# Patient Record
Sex: Female | Born: 1995 | Race: Black or African American | Hispanic: No | Marital: Single | Smoking: Never smoker
Health system: Southern US, Community
[De-identification: ages and names within clinical notes are randomized; demographics above are authoritative.]

## PROBLEM LIST (undated history)

## (undated) DIAGNOSIS — R0789 Other chest pain: Secondary | ICD-10-CM

## (undated) DIAGNOSIS — I38 Endocarditis, valve unspecified: Secondary | ICD-10-CM

## (undated) HISTORY — PX: WISDOM TOOTH EXTRACTION: SHX21

## (undated) HISTORY — DX: Endocarditis, valve unspecified: I38

## (undated) HISTORY — DX: Other chest pain: R07.89

---

## 2018-10-04 ENCOUNTER — Emergency Department (HOSPITAL_COMMUNITY): Payer: BLUE CROSS/BLUE SHIELD

## 2018-10-04 ENCOUNTER — Encounter (HOSPITAL_COMMUNITY): Payer: Self-pay

## 2018-10-04 ENCOUNTER — Inpatient Hospital Stay (HOSPITAL_COMMUNITY)
Admission: EM | Admit: 2018-10-04 | Discharge: 2018-10-08 | DRG: 307 | Disposition: A | Discharge status: Home Health | Payer: BLUE CROSS/BLUE SHIELD | Attending: Cardiovascular Disease | Admitting: Cardiovascular Disease

## 2018-10-04 ENCOUNTER — Other Ambulatory Visit: Payer: Self-pay

## 2018-10-04 DIAGNOSIS — R079 Chest pain, unspecified: Secondary | ICD-10-CM

## 2018-10-04 DIAGNOSIS — F419 Anxiety disorder, unspecified: Secondary | ICD-10-CM | POA: Diagnosis present

## 2018-10-04 DIAGNOSIS — I4510 Unspecified right bundle-branch block: Secondary | ICD-10-CM | POA: Diagnosis present

## 2018-10-04 DIAGNOSIS — K011 Impacted teeth: Secondary | ICD-10-CM | POA: Diagnosis present

## 2018-10-04 DIAGNOSIS — I011 Acute rheumatic endocarditis: Principal | ICD-10-CM | POA: Diagnosis present

## 2018-10-04 DIAGNOSIS — I330 Acute and subacute infective endocarditis: Secondary | ICD-10-CM | POA: Diagnosis not present

## 2018-10-04 DIAGNOSIS — Z793 Long term (current) use of hormonal contraceptives: Secondary | ICD-10-CM | POA: Diagnosis not present

## 2018-10-04 DIAGNOSIS — Z0389 Encounter for observation for other suspected diseases and conditions ruled out: Secondary | ICD-10-CM

## 2018-10-04 DIAGNOSIS — Z79899 Other long term (current) drug therapy: Secondary | ICD-10-CM | POA: Diagnosis not present

## 2018-10-04 DIAGNOSIS — I442 Atrioventricular block, complete: Secondary | ICD-10-CM

## 2018-10-04 LAB — BASIC METABOLIC PANEL
ANION GAP: 12 (ref 5–15)
BUN: 10 mg/dL (ref 6–20)
CO2: 22 mmol/L (ref 22–32)
Calcium: 9.7 mg/dL (ref 8.9–10.3)
Chloride: 104 mmol/L (ref 98–111)
Creatinine, Ser: 0.75 mg/dL (ref 0.44–1.00)
GFR calc Af Amer: >60 mL/min (ref >60)
GFR calc non Af Amer: >60 mL/min (ref >60)
Glucose, Bld: 97 mg/dL (ref 70–99)
Potassium: 3.6 mmol/L (ref 3.5–5.1)
Sodium: 138 mmol/L (ref 135–145)

## 2018-10-04 LAB — TSH: TSH: 3.072 u[IU]/mL (ref 0.350–4.500)

## 2018-10-04 LAB — CBC
HCT: 42.4 % (ref 36.0–46.0)
HEMOGLOBIN: 13.6 g/dL (ref 12.0–15.0)
MCH: 26.5 pg (ref 26.0–34.0)
MCHC: 32.1 g/dL (ref 30.0–36.0)
MCV: 82.5 fL (ref 80.0–100.0)
Platelets: 221 10*3/uL (ref 150–400)
RBC: 5.14 MIL/uL — ABNORMAL HIGH (ref 3.87–5.11)
RDW: 14.2 % (ref 11.5–15.5)
WBC: 8.6 10*3/uL (ref 4.0–10.5)
nRBC: 0 % (ref 0.0–0.2)

## 2018-10-04 LAB — ECHOCARDIOGRAM COMPLETE
Height: 63 in
Weight: 3008 oz

## 2018-10-04 LAB — SEDIMENTATION RATE: Sed Rate: 9 mm/hr (ref 0–22)

## 2018-10-04 LAB — I-STAT BETA HCG BLOOD, ED (MC, WL, AP ONLY)

## 2018-10-04 LAB — TROPONIN I: Troponin I: <0.03 ng/mL (ref <0.03)

## 2018-10-04 LAB — C-REACTIVE PROTEIN: CRP: <0.8 mg/dL (ref <1.0)

## 2018-10-04 LAB — I-STAT TROPONIN, ED: Troponin i, poc: 0.01 ng/mL (ref 0.00–0.08)

## 2018-10-04 LAB — BRAIN NATRIURETIC PEPTIDE: B Natriuretic Peptide: 34.2 pg/mL (ref 0.0–100.0)

## 2018-10-04 LAB — MAGNESIUM: Magnesium: 2.1 mg/dL (ref 1.7–2.4)

## 2018-10-04 MED ORDER — HYDROXYZINE HCL 25 MG PO TABS
25.0000 mg | ORAL_TABLET | Freq: Four times a day (QID) | ORAL | Status: DC | PRN
  Filled 2018-10-04: qty 1

## 2018-10-04 MED ORDER — NORETHIN ACE-ETH ESTRAD-FE 1-20 MG-MCG PO TABS
1.0000 | ORAL_TABLET | Freq: Every day | ORAL | Status: DC
  Administered 2018-10-04: 1 via ORAL
  Filled 2018-10-04: qty 1

## 2018-10-04 MED ORDER — ACETAMINOPHEN 325 MG PO TABS
650.0000 mg | ORAL_TABLET | Freq: Four times a day (QID) | ORAL | Status: DC | PRN
  Administered 2018-10-04 – 2018-10-06 (×5): 650 mg via ORAL
  Filled 2018-10-04 (×6): qty 2

## 2018-10-04 MED ORDER — SODIUM CHLORIDE 0.9% FLUSH
3.0000 mL | Freq: Two times a day (BID) | INTRAVENOUS | Status: DC
  Administered 2018-10-04 – 2018-10-07 (×6): 3 mL via INTRAVENOUS

## 2018-10-04 MED ORDER — HEPARIN SODIUM (PORCINE) 5000 UNIT/ML IJ SOLN
5000.0000 [IU] | Freq: Three times a day (TID) | INTRAMUSCULAR | Status: DC
  Administered 2018-10-04 – 2018-10-08 (×12): 5000 [IU] via SUBCUTANEOUS
  Filled 2018-10-04 (×12): qty 1

## 2018-10-04 MED ORDER — SODIUM CHLORIDE 0.9% FLUSH
3.0000 mL | Freq: Once | INTRAVENOUS | Status: AC
  Administered 2018-10-04: 3 mL via INTRAVENOUS

## 2018-10-04 MED ORDER — SODIUM CHLORIDE 0.9% FLUSH: 3.0000 mL | INTRAVENOUS | Status: DC | PRN

## 2018-10-04 MED ORDER — SODIUM CHLORIDE 0.9 % IV SOLN
250.0000 mL | INTRAVENOUS | Status: DC | PRN
  Administered 2018-10-06: 250 mL via INTRAVENOUS

## 2018-10-04 MED ORDER — LIDOCAINE VISCOUS HCL 2 % MT SOLN
15.0000 mL | Freq: Once | OROMUCOSAL | Status: AC
  Administered 2018-10-04: 15 mL via ORAL
  Filled 2018-10-04: qty 15

## 2018-10-04 MED ORDER — SODIUM CHLORIDE 0.9 % IV SOLN
2.0000 g | INTRAVENOUS | Status: DC
  Administered 2018-10-04 – 2018-10-08 (×5): 2 g via INTRAVENOUS
  Filled 2018-10-04 (×5): qty 20

## 2018-10-04 MED ORDER — VANCOMYCIN HCL 10 G IV SOLR
1250.0000 mg | Freq: Two times a day (BID) | INTRAVENOUS | Status: DC
  Administered 2018-10-05 – 2018-10-08 (×7): 1250 mg via INTRAVENOUS
  Filled 2018-10-04 (×7): qty 1250

## 2018-10-04 MED ORDER — ALUM & MAG HYDROXIDE-SIMETH 200-200-20 MG/5ML PO SUSP
30.0000 mL | Freq: Once | ORAL | Status: AC
  Administered 2018-10-04: 30 mL via ORAL
  Filled 2018-10-04: qty 30

## 2018-10-04 MED ORDER — VANCOMYCIN HCL 10 G IV SOLR
2000.0000 mg | INTRAVENOUS | Status: AC
  Administered 2018-10-04: 2000 mg via INTRAVENOUS
  Filled 2018-10-04: qty 2000

## 2018-10-04 MED ORDER — SODIUM CHLORIDE 0.9 % IV BOLUS
1000.0000 mL | Freq: Once | INTRAVENOUS | Status: AC
  Administered 2018-10-04: 1000 mL via INTRAVENOUS

## 2018-10-04 MED ORDER — LORAZEPAM 2 MG/ML IJ SOLN
1.0000 mg | Freq: Once | INTRAMUSCULAR | Status: AC
  Administered 2018-10-04: 1 mg via INTRAVENOUS
  Filled 2018-10-04: qty 1

## 2018-10-04 NOTE — ED Notes (Signed)
As I write this, pt. Is undergoing 2D Echo. Dr. Algie Coffer is here also. Pt. Tells [place] she feels "better".

## 2018-10-04 NOTE — ED Notes (Signed)
I have just phoned report to Pamela Maynard at [facility]. I will notify [facility] now. Pt. Remains in no distress.

## 2018-10-04 NOTE — ED Notes (Signed)
She is taken to [facility] at this time without incident by [facility].

## 2018-10-04 NOTE — H&P (Signed)
Referring Physician:   [name] is an 23 y.o. female.                       Chief Complaint: Chest pain  HPI: 23 year old female had mild lower abdominal cramping worsening to pressure type discomfort in epigastric area radiating to chest and back associated with exertional shortness of breath and palpitations. Chest pain is more when lying flat. No fever or chills. No IV drug abuse. She admits to smoking Marijuana few days a week. She had viral infection several weeks ago. Her EKG shows recurrent idioventricular rhythm to sinus rhythm with frequent premature ventricular complexes. Her echocardiogram showed normal LV and RV systolic function but small mobile vegetations on Mitral valve with trivial MR. Her CBC, BMET, CRP, TSH and Troponin I are normal. Blood cultures are pending.  History reviewed. No pertinent past medical history. No type 2 DM, No HTN, No hyperlipidemia.   Past Surgical History:  Procedure Laterality Date  . WISDOM TOOTH EXTRACTION      Family History  Problem Relation Age of Onset  . Hypertension Mother   . Diabetes Mother    Social History:  reports that she has never smoked. She has never used smokeless tobacco. She reports current alcohol use. She reports current drug use. Drug: Marijuana.  Allergies: No Known Allergies  (Not in a hospital admission)   Results for orders placed or performed during the hospital encounter of 10/04/18 (from the past 48 hour(s))  Basic metabolic panel     Status: None   Collection Time: 10/04/18 11:20 AM  Result Value Ref Range   Sodium 138 135 - 145 mmol/L   Potassium 3.6 3.5 - 5.1 mmol/L   Chloride 104 98 - 111 mmol/L   CO2 22 22 - 32 mmol/L   Glucose, Bld 97 70 - 99 mg/dL   BUN 10 6 - 20 mg/dL   Creatinine, Ser 9.73 0.44 - 1.00 mg/dL   Calcium 9.7 8.9 - 53.2 mg/dL   GFR calc non Af Amer >60 >60 mL/min   GFR calc Af Amer >60 >60 mL/min   Anion gap 12 5 - 15    Comment: Performed at [facility],  [address], [place], [place] [address]  CBC     Status: Abnormal   Collection Time: 10/04/18 11:20 AM  Result Value Ref Range   WBC 8.6 4.0 - 10.5 K/uL   RBC 5.14 (H) 3.87 - 5.11 MIL/uL   Hemoglobin 13.6 12.0 - 15.0 g/dL   HCT 68.3 41.9 - 62.2 %   MCV 82.5 80.0 - 100.0 fL   MCH 26.5 26.0 - 34.0 pg   MCHC 32.1 30.0 - 36.0 g/dL   RDW 29.7 98.9 - 21.1 %   Platelets 221 150 - 400 K/uL   nRBC 0.0 0.0 - 0.2 %    Comment: Performed at [facility], [address] [place], [place] [address]  Magnesium     Status: None   Collection Time: 10/04/18 11:20 AM  Result Value Ref Range   Magnesium 2.1 1.7 - 2.4 mg/dL    Comment: Performed at [facility], [address] [place], [place] [address]  TSH     Status: None   Collection Time: 10/04/18 11:20 AM  Result Value Ref Range   TSH 3.072 0.350 - 4.500 uIU/mL    Comment: Performed by a 3rd Generation assay with a functional sensitivity of <=0.01 uIU/mL.  Performed at [facility], [address] [place], [place] [address]   C-reactive protein     Status: None   Collection Time: 10/04/18 11:25 AM  Result Value Ref Range   CRP <0.8 <1.0 mg/dL    Comment: Performed at [facility], [address] [place], [place] [address]  I-stat troponin, ED     Status: None   Collection Time: 10/04/18 11:30 AM  Result Value Ref Range   Troponin i, poc 0.01 0.00 - 0.08 ng/mL   Comment 3            Comment: Due to the release kinetics of cTnI, a negative result within the first hours of the onset of symptoms does not rule out myocardial infarction with certainty. If myocardial infarction is still suspected, repeat the test at appropriate intervals.   Troponin I - ONCE - STAT     Status: None   Collection Time: 10/04/18 11:30 AM  Result Value Ref Range   Troponin I <0.03 <0.03 ng/mL    Comment: Performed at [facility], [address]  [place], [place] [address]  Brain natriuretic peptide     Status: None   Collection Time: 10/04/18 11:30 AM  Result Value Ref Range   B Natriuretic Peptide 34.2 0.0 - 100.0 pg/mL    Comment: Performed at [facility], [address] [place], [place] [address]  I-Stat beta hCG blood, ED     Status: None   Collection Time: 10/04/18 11:31 AM  Result Value Ref Range   I-stat hCG, quantitative <5.0 <5 mIU/mL   Comment 3            Comment:   GEST. AGE      CONC.  (mIU/mL)   <=1 WEEK        5 - 50     2 WEEKS       50 - 500     3 WEEKS       100 - 10,000     4 WEEKS     1,000 - 30,000        FEMALE AND NON-PREGNANT FEMALE:     LESS THAN 5 mIU/mL   Sedimentation rate     Status: None   Collection Time: 10/04/18 12:12 PM  Result Value Ref Range   Sed Rate 9 0 - 22 mm/hr    Comment: Performed at [facility], [address] [place], [place] [address]   Dg Chest Portable 1 View  Result Date: 10/04/2018 CLINICAL DATA:  Chest pain for 2 days with shortness of breath EXAM: PORTABLE CHEST 1 VIEW COMPARISON:  None. FINDINGS: The heart size and mediastinal contours are within normal limits. Both lungs are clear. The visualized skeletal structures are unremarkable. IMPRESSION: No active disease. Electronically Signed   By: Alcide CleverMark  Lukens M.D.   On: 10/04/2018 12:49    Review Of Systems Constitutional: No fever, chills, weight loss or gain. Eyes: No vision change, wears glasses. No discharge or pain. Ears: No hearing loss, No tinnitus. Respiratory: No asthma, COPD, pneumonias. Positive shortness of breath. No hemoptysis. Cardiovascular: Positive chest pain, palpitation, no leg edema. Gastrointestinal: No nausea, vomiting, diarrhea, constipation. No GI bleed. No hepatitis. Genitourinary: No dysuria, hematuria, kidney stone. No incontinance. Neurological: No headache, stroke, seizures.  Psychiatry: No psych facility admission for anxiety, depression, suicide. No  detox. Skin: No rash. Musculoskeletal: No joint pain, no fibromyalgia. No neck pain, back pain. Lymphadenopathy:  No lymphadenopathy. Hematology: No anemia or easy bruising.   Blood pressure (!) 162/79, pulse 88, temperature 97.8 F (36.6 C), temperature source Oral, resp. rate 18, height 5\' 3"  (1.6 m), weight 85.3 kg, last menstrual period 10/04/2018, SpO2 98 %. Body mass index is 33.3 kg/m. General appearance: alert, cooperative, appears stated age and no distress Head: Normocephalic, atraumatic. Eyes: Brown eyes, pink conjunctiva, corneas clear. PERRL, EOM's intact. Neck: No adenopathy, no carotid bruit, no JVD, supple, symmetrical, trachea midline and thyroid not enlarged. Resp: Clear to auscultation bilaterally. Cardio: Regular rate and rhythm, S1, S2 normal, II/VI systolic murmur, no click, rub or gallop. Fixed split S2 when in idioventricular rhythm with RBBB. GI: Soft, non-tender; bowel sounds normal; no organomegaly. Extremities: No edema, cyanosis or clubbing. Skin: Warm and dry.  Neurologic: Alert and oriented X 3, normal strength. Normal coordination and gait.  Assessment/Plan Acute MV endocarditis Chest pain Shortness of breath Palpitation Recurrent idioventricular rhythm  Admit to cardiac Telemetry. Blood culture x 2 x 2. ID consult for antibiotic use. TEE tomorrow.  Ricki Rodriguez, MD  10/04/2018, 2:34 PM

## 2018-10-04 NOTE — ED Notes (Signed)
ED TO INPATIENT HANDOFF REPORT  Name/Age/Gender Pamela Maynard 23 y.o. female  Code Status    Code Status Orders  (From admission, onward)         Start     Ordered   10/04/18 1426  Full code  Continuous     10/04/18 1430        Code Status History    This patient has a current code status but no historical code status.      Home/SNF/Other Home  Chief Complaint chest pain  Level of Care/Admitting Diagnosis ED Disposition    ED Disposition Condition Comment   Admit  [facility] [[id number]]  Level of Care: Cardiac Telemetry [103]  Diagnosis: Endocarditis of mitral valve [[id number]][[id number]]  Admitting Physician: Orpah CobbKADAKIA, AJAY [[id number]]  Attending Physician: Orpah CobbKADAKIA, AJAY [[id number]]  Estimated length of stay: 5 - 7 days  Certification:: I certify this patient will need inpatient services for at least 2 midnights  PT Class (Do Not Modify): Inpatient [101]  PT Acc Code (Do Not Modify): Private [1]       Medical History History reviewed. No pertinent past medical history.  Allergies No Known Allergies  IV Location/Drains/Wounds Patient Lines/Drains/Airways Status   Active Line/Drains/Airways    Name:   Placement date:   Placement time:   Site:   Days:   Peripheral IV 10/04/18 Right Antecubital   10/04/18    1131    Antecubital   less than 1          Labs/Imaging Results for orders placed or performed during the hospital encounter of 10/04/18 (from the past 48 hour(s))  Basic metabolic panel     Status: None   Collection Time: 10/04/18 11:20 AM  Result Value Ref Range   Sodium 138 135 - 145 mmol/L   Potassium 3.6 3.5 - 5.1 mmol/L   Chloride 104 98 - 111 mmol/L   CO2 22 22 - 32 mmol/L   Glucose, Bld 97 70 - 99 mg/dL   BUN 10 6 - 20 mg/dL   Creatinine, Ser 8.110.75 0.44 - 1.00 mg/dL   Calcium 9.7 8.9 - 91.410.3 mg/dL   GFR calc non Af Amer >60 >60 mL/min   GFR calc Af Amer >60 >60 mL/min   Anion gap 12 5 - 15    Comment: Performed at [facility], [address] [place], [place] [address]  CBC     Status: Abnormal   Collection Time: 10/04/18 11:20 AM  Result Value Ref Range   WBC 8.6 4.0 - 10.5 K/uL   RBC 5.14 (H) 3.87 - 5.11 MIL/uL   Hemoglobin 13.6 12.0 - 15.0 g/dL   HCT 62.142.4 30.836.0 - 65.746.0 %   MCV 82.5 80.0 - 100.0 fL   MCH 26.5 26.0 - 34.0 pg   MCHC 32.1 30.0 - 36.0 g/dL   RDW 84.614.2 96.211.5 - 95.215.5 %   Platelets 221 150 - 400 K/uL   nRBC 0.0 0.0 - 0.2 %    Comment: Performed at [facility], [address] [place], [place] [address]  Magnesium     Status: None   Collection Time: 10/04/18 11:20 AM  Result Value Ref Range   Magnesium 2.1 1.7 - 2.4 mg/dL    Comment: Performed at [facility], [address] [place], [place] [address]  TSH     Status: None   Collection Time: 10/04/18 11:20 AM  Result Value Ref Range   TSH  3.072 0.350 - 4.500 uIU/mL    Comment: Performed by a 3rd Generation assay with a functional sensitivity of <=0.01 uIU/mL. Performed at [facility], [address] [place], [place] [address]   C-reactive protein     Status: None   Collection Time: 10/04/18 11:25 AM  Result Value Ref Range   CRP <0.8 <1.0 mg/dL    Comment: Performed at [facility], [address] [place], [place] [address]  I-stat troponin, ED     Status: None   Collection Time: 10/04/18 11:30 AM  Result Value Ref Range   Troponin i, poc 0.01 0.00 - 0.08 ng/mL   Comment 3            Comment: Due to the release kinetics of cTnI, a negative result within the first hours of the onset of symptoms does not rule out myocardial infarction with certainty. If myocardial infarction is still suspected, repeat the test at appropriate intervals.   Troponin I - ONCE - STAT     Status: None   Collection Time: 10/04/18 11:30 AM  Result Value Ref Range   Troponin I <0.03 <0.03 ng/mL    Comment: Performed at [facility], [address] [place], [place] [address]  Brain natriuretic peptide     Status: None   Collection Time: 10/04/18 11:30 AM  Result Value Ref Range   B Natriuretic Peptide 34.2 0.0 - 100.0 pg/mL    Comment: Performed at [facility], [address] [place], [place] [address]  I-Stat beta hCG blood, ED     Status: None   Collection Time: 10/04/18 11:31 AM  Result Value Ref Range   I-stat hCG, quantitative <5.0 <5 mIU/mL   Comment 3            Comment:   GEST. AGE      CONC.  (mIU/mL)   <=1 WEEK        5 - 50     2 WEEKS       50 - 500     3 WEEKS       100 - 10,000     4 WEEKS     1,000 - 30,000        FEMALE AND NON-PREGNANT FEMALE:     LESS THAN 5 mIU/mL   Sedimentation rate     Status: None   Collection Time: 10/04/18 12:12 PM  Result Value Ref Range   Sed Rate 9 0 - 22 mm/hr    Comment: Performed at [facility], [address] [place], [place] [address]   Dg Chest Portable 1 View  Result Date: 10/04/2018 CLINICAL DATA:  Chest pain for 2 days with shortness of breath EXAM: PORTABLE CHEST 1 VIEW COMPARISON:  None. FINDINGS: The heart size and mediastinal contours are within normal limits. Both lungs are clear. The visualized skeletal structures are unremarkable. IMPRESSION: No active disease. Electronically Signed   By: Alcide CleverMark  Lukens M.D.   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   Question:  Patient immune status  Answer:  Normal   10/04/18 1433   10/04/18 1429  Urinalysis, Routine w reflex microscopic  Once,   R     10/04/18 1430   10/04/18 1425  HIV antibody (Routine Testing)  Once,   R     10/04/18 1430          Vitals/Pain Today's Vitals   10/04/18 1400 10/04/18 1430 10/04/18 1446 10/04/18 1500  BP: 109/66 124/82 124/82 107/63  Pulse: 62 92 67 65  Resp: (!) 25 19 16 15   Temp:      TempSrc:      SpO2: 99% 100% 100% 100%  Weight:      Height:      PainSc:        Isolation Precautions No active isolations  Medications Medications  heparin injection 5,000 Units (has no administration in time range)  sodium chloride flush (NS) 0.9 % injection 3 mL (has no administration in time range)  sodium chloride flush (NS) 0.9 % injection 3 mL (has no administration in time range)  0.9 %  sodium chloride infusion (has no administration in time range)  hydrOXYzine (ATARAX/VISTARIL) tablet 25 mg (has no administration in time range)  norethindrone-ethinyl estradiol (JUNEL FE,GILDESS FE,LOESTRIN FE) 1-20 MG-MCG per tablet 1 tablet (has no administration in time range)  cefTRIAXone (ROCEPHIN) 2 g in sodium chloride 0.9 % 100 mL IVPB (has no administration in time range)  sodium chloride flush (NS) 0.9 % injection 3 mL (3 mLs Intravenous Given 10/04/18 1110)  sodium chloride 0.9 % bolus 1,000 mL (0 mLs Intravenous Stopped 10/04/18 1330)  LORazepam (ATIVAN) injection 1 mg (1 mg Intravenous Given 10/04/18 1225)  alum & mag hydroxide-simeth (MAALOX/MYLANTA) 200-200-20 MG/5ML suspension 30 mL (30 mLs Oral Given 10/04/18 1224)    And  lidocaine (XYLOCAINE) 2 % viscous mouth solution 15 mL (15 mLs Oral Given 10/04/18 1224)    Mobility walks

## 2018-10-04 NOTE — ED Provider Notes (Addendum)
Pamela Maynard Provider Note   CSN: [id number] Arrival date & time: 10/04/18  1036     History   Chief Complaint Chief Complaint  Patient presents with  . Chest Pain    HPI Pamela Maynard is a 23 y.o. female.  HPI   23 yo F here with chest pain, SOB, palpitations. Pt reports that over the past several days, her sx started as what felt like mild lower abd cramping. Over the past several days, however, she now has aching, pressure like epigastric pan and discomfort that radiates into her chest and back. She has associated SOB, as well as pain that is worse lying flat. No fever, chills, nausea, vomiting. She feels her SOB worsens w/ exertion. She's had associated palpitations and sensation like her heart was skipping beats. No recent illnesses. She did have viral sx several weeks ago. No h/o cardiac abnormalities. No h/o heart issues in family.  History reviewed. No pertinent past medical history.  There are no active problems to display for this patient.   Past Surgical History:  Procedure Laterality Date  . WISDOM TOOTH EXTRACTION       OB History   No obstetric history on file.      Home Medications    Prior to Admission medications   Medication Sig Start Date End Date Taking? Authorizing Provider  TARINA FE 1/20 EQ 1-20 MG-MCG tablet Take 1 tablet by mouth daily. 09/10/18  Yes [provider]    Family History Family History  Problem Relation Age of Onset  . Hypertension Mother   . Diabetes Mother     Social History Social History   Tobacco Use  . Smoking status: Never Smoker  . Smokeless tobacco: Never Used  Substance Use Topics  . Alcohol use: Yes    Comment: occasinally  . Drug use: Yes    Types: Marijuana    Comment: 2-3 times a week     Allergies   Patient has no known allergies.   Review of Systems Review of Systems  Constitutional: Positive for fatigue. Negative for chills and fever.  HENT:  Negative for congestion and rhinorrhea.   Eyes: Negative for visual disturbance.  Respiratory: Positive for chest tightness and shortness of breath. Negative for cough and wheezing.   Cardiovascular: Positive for chest pain. Negative for leg swelling.  Gastrointestinal: Negative for abdominal pain, diarrhea, nausea and vomiting.  Genitourinary: Negative for dysuria and flank pain.  Musculoskeletal: Negative for neck pain and neck stiffness.  Skin: Negative for rash and wound.  Allergic/Immunologic: Negative for immunocompromised state.  Neurological: Positive for weakness. Negative for syncope and headaches.  All other systems reviewed and are negative.    Physical Exam Updated Vital Signs BP 123/83 (BP Location: Left Arm)   Pulse 68   Temp 97.8 F (36.6 C) (Oral)   Resp 19   Ht 5\' 3"  (1.6 m)   Wt 85.3 kg   LMP 10/04/2018   SpO2 100%   BMI 33.30 kg/m   Physical Exam Vitals signs and nursing note reviewed.  Constitutional:      General: She is not in acute distress.    Appearance: She is well-developed.  HENT:     Head: Normocephalic and atraumatic.  Eyes:     Conjunctiva/sclera: Conjunctivae normal.  Neck:     Musculoskeletal: Neck supple.  Cardiovascular:     Rate and Rhythm: Normal rate. Rhythm irregular.  Extrasystoles are present.    Heart sounds: Normal heart sounds.  No murmur. No friction rub.  Pulmonary:     Effort: Pulmonary effort is normal. No respiratory distress.     Breath sounds: Normal breath sounds. No wheezing or rales.  Abdominal:     General: There is no distension.     Palpations: Abdomen is soft.     Tenderness: There is no abdominal tenderness.  Skin:    General: Skin is warm.     Capillary Refill: Capillary refill takes less than 2 seconds.  Neurological:     Mental Status: She is alert and oriented to person, place, and time.     Motor: No abnormal muscle tone.      ED Treatments / Results  Labs (all labs ordered are listed, but  only abnormal results are displayed) Labs Reviewed  CBC - Abnormal; Notable for the following components:      Result Value   RBC 5.14 (*)    All other components within normal limits  BASIC METABOLIC PANEL  MAGNESIUM  TSH  TROPONIN I  BRAIN NATRIURETIC PEPTIDE  C-REACTIVE PROTEIN  SEDIMENTATION RATE  I-STAT TROPONIN, ED  I-STAT BETA HCG BLOOD, ED ([facility], [facility], [facility] ONLY)    EKG EKG Interpretation  Date/Time:  Thursday October 04 2018 10:48:32 EST Ventricular Rate:  65 PR Interval:    QRS Duration: 161 QT Interval:  469 QTC Calculation: 488 R Axis:   -109 Text Interpretation:  Suspect accelerated idioventricular rhythm 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 FINDINGS: The heart size and mediastinal contours are within normal limits. Both lungs are clear. The visualized skeletal structures are unremarkable. IMPRESSION: No active disease. Electronically Signed   By: Alcide Clever M.D.   On: 10/04/2018 12:49    Procedures Procedures (including critical care time)  Medications Ordered in ED Medications  sodium chloride flush (NS) 0.9 % injection 3 mL (3 mLs Intravenous Given 10/04/18 1110)  sodium chloride 0.9 % bolus 1,000 mL (1,000 mLs Intravenous New Bag/Given 10/04/18 1156)  LORazepam (ATIVAN) injection 1 mg (1 mg Intravenous Given 10/04/18 1225)  alum & mag hydroxide-simeth (MAALOX/MYLANTA) 200-200-20 MG/5ML suspension 30 mL (30 mLs Oral Given 10/04/18 1224)    And  lidocaine (XYLOCAINE) 2 % viscous mouth solution 15 mL (15 mLs Oral Given 10/04/18 1224)     Initial Impression / Assessment and Plan / ED Course  I have reviewed the triage vital signs and the nursing notes.  Pertinent labs & imaging results that were available during my care of the  patient were reviewed by me and considered in my medical decision making (see chart for details).  Clinical Course as of Oct 04 1256  Thu Oct 04, 2018  1228 23 yo F here with atypical CP. EKG here shows accelerated idioventricular rhythm, with occasional sinus escape beats. DDx broad - her sx are concerning for possible pericarditis, consider symptomatic arrhythmia, gastritis/PUD with vagal response. Less likely ACS. Labs, imaging sent.   [CI]  1247 Labs reassuring. Cardiology paged.    [CI]    Clinical Course User Index [CI] Shaune Pollack, MD    Pt now in more predominately NSR and feels better, though she continues to alternat with an AIV rhythm. Discussed with Dr. Algie Coffer, will admit.  Final Clinical Impressions(s) / ED Diagnoses  Final diagnoses:  Accelerated idioventricular rhythm [facility](HCC)    ED Discharge Orders    None       Shaune PollackIsaacs, Lonny Eisen, MD 10/04/18 1259    Shaune PollackIsaacs, Levonia Wolfley, MD 10/04/18 [phone]

## 2018-10-04 NOTE — Progress Notes (Signed)
Pharmacy Antibiotic Note  Pamela Maynard is a 23 y.o. female admitted on 10/04/2018 with acute MV endocarditis.  Pharmacy has been consulted for Vancomycin dosing. Patient also placed on Ceftriaxone per ID.   Plan: Vancomycin 2g IV x 1, then 1250mg  IV q12h. Plan for Vancomycin levels at steady state, as indicated.  Ceftriaxone 2g IV q24h per ID. Monitor renal function, cultures, clinical course.    Height: 5\' 3"  (160 cm) Weight: 188 lb (85.3 kg) IBW/kg (Calculated) : 52.4  Temp (24hrs), Avg:97.8 F (36.6 C), Min:97.8 F (36.6 C), Max:97.8 F (36.6 C)  Recent Labs  Lab 10/04/18 1120  WBC 8.6  CREATININE 0.75    Estimated Creatinine Clearance: 116.2 mL/min (by C-G formula based on SCr of 0.75 mg/dL).    No Known Allergies  Antimicrobials this admission: 1/30 Vancomycin >> 1/30 Ceftriaxone >>  Dose adjustments this admission: --  Microbiology results: 1/30 BCx: sent   Thank you for allowing pharmacy to be a part of this patient's care.   Greer Pickerel, PharmD, BCPS Pager: [phone] 10/04/2018 5:05 PM

## 2018-10-04 NOTE — ED Triage Notes (Addendum)
Patient c/o mid and lower chest pain since yesterday. Patient  c/o SOB,but denies any cough or upper respiratory symptoms.

## 2018-10-04 NOTE — Progress Notes (Signed)
Pt received from [facility] via [facility], pt stable, ccmd called to verify , orders reviewed, vanc staerted as ordered call light in reach

## 2018-10-05 ENCOUNTER — Inpatient Hospital Stay (HOSPITAL_COMMUNITY): Payer: BLUE CROSS/BLUE SHIELD | Admitting: Certified Registered Nurse Anesthetist

## 2018-10-05 ENCOUNTER — Encounter (HOSPITAL_COMMUNITY): Payer: Self-pay | Admitting: Certified Registered Nurse Anesthetist

## 2018-10-05 ENCOUNTER — Inpatient Hospital Stay (HOSPITAL_COMMUNITY): Payer: BLUE CROSS/BLUE SHIELD

## 2018-10-05 ENCOUNTER — Encounter (HOSPITAL_COMMUNITY)
Admission: EM | Disposition: A | Discharge status: Home Health | Payer: Self-pay | Source: Home / Self Care | Attending: Cardiovascular Disease

## 2018-10-05 DIAGNOSIS — K011 Impacted teeth: Secondary | ICD-10-CM

## 2018-10-05 DIAGNOSIS — I4510 Unspecified right bundle-branch block: Secondary | ICD-10-CM

## 2018-10-05 DIAGNOSIS — Z79899 Other long term (current) drug therapy: Secondary | ICD-10-CM

## 2018-10-05 DIAGNOSIS — I330 Acute and subacute infective endocarditis: Secondary | ICD-10-CM

## 2018-10-05 DIAGNOSIS — Z793 Long term (current) use of hormonal contraceptives: Secondary | ICD-10-CM

## 2018-10-05 HISTORY — PX: TEE WITHOUT CARDIOVERSION: SHX5443

## 2018-10-05 LAB — RAPID URINE DRUG SCREEN, HOSP PERFORMED
AMPHETAMINES: NOT DETECTED
Barbiturates: NOT DETECTED
Benzodiazepines: POSITIVE — AB
Cocaine: NOT DETECTED
Opiates: NOT DETECTED
Tetrahydrocannabinol: POSITIVE — AB

## 2018-10-05 LAB — TROPONIN I
Troponin I: <0.03 ng/mL (ref <0.03)
Troponin I: <0.03 ng/mL (ref <0.03)

## 2018-10-05 LAB — URINALYSIS, ROUTINE W REFLEX MICROSCOPIC
Bilirubin Urine: NEGATIVE
Glucose, UA: NEGATIVE mg/dL
Ketones, ur: 20 mg/dL — AB
Leukocytes, UA: NEGATIVE
Nitrite: NEGATIVE
Protein, ur: NEGATIVE mg/dL
RBC / HPF: >50 RBC/hpf — ABNORMAL HIGH (ref 0–5)
Specific Gravity, Urine: 1.021 (ref 1.005–1.030)
pH: 7 (ref 5.0–8.0)

## 2018-10-05 LAB — BASIC METABOLIC PANEL
ANION GAP: 9 (ref 5–15)
BUN: 5 mg/dL — ABNORMAL LOW (ref 6–20)
CO2: 21 mmol/L — ABNORMAL LOW (ref 22–32)
Calcium: 9 mg/dL (ref 8.9–10.3)
Chloride: 108 mmol/L (ref 98–111)
Creatinine, Ser: 0.66 mg/dL (ref 0.44–1.00)
Glucose, Bld: 99 mg/dL (ref 70–99)
Potassium: 3.8 mmol/L (ref 3.5–5.1)
Sodium: 138 mmol/L (ref 135–145)

## 2018-10-05 LAB — HIV ANTIBODY (ROUTINE TESTING W REFLEX): HIV Screen 4th Generation wRfx: NONREACTIVE

## 2018-10-05 SURGERY — ECHOCARDIOGRAM, TRANSESOPHAGEAL
Anesthesia: Monitor Anesthesia Care

## 2018-10-05 MED ORDER — LACTATED RINGERS IV SOLN
INTRAVENOUS | Status: DC | PRN
  Administered 2018-10-05: 12:00:00 via INTRAVENOUS

## 2018-10-05 MED ORDER — PROPOFOL 10 MG/ML IV BOLUS
INTRAVENOUS | Status: DC | PRN
  Administered 2018-10-05: 30 mg via INTRAVENOUS
  Administered 2018-10-05 (×3): 20 mg via INTRAVENOUS
  Administered 2018-10-05: 30 mg via INTRAVENOUS

## 2018-10-05 MED ORDER — MIDAZOLAM HCL 2 MG/2ML IJ SOLN
INTRAMUSCULAR | Status: AC
  Filled 2018-10-05: qty 2

## 2018-10-05 MED ORDER — SODIUM CHLORIDE 0.9 % IV SOLN
INTRAVENOUS | Status: DC
  Administered 2018-10-05 – 2018-10-07 (×5): via INTRAVENOUS

## 2018-10-05 MED ORDER — MIDAZOLAM HCL 5 MG/5ML IJ SOLN
INTRAMUSCULAR | Status: DC | PRN
  Administered 2018-10-05: 2 mg via INTRAVENOUS

## 2018-10-05 MED ORDER — PROPOFOL 500 MG/50ML IV EMUL
INTRAVENOUS | Status: DC | PRN
  Administered 2018-10-05: 125 ug/kg/min via INTRAVENOUS

## 2018-10-05 MED ORDER — LIDOCAINE 2% (20 MG/ML) 5 ML SYRINGE
INTRAMUSCULAR | Status: DC | PRN
  Administered 2018-10-05: 60 mg via INTRAVENOUS

## 2018-10-05 NOTE — Interval H&P Note (Signed)
History and Physical Interval Note:  10/05/2018 11:09 AM  Pamela Maynard  has presented today for surgery, with the diagnosis of bacteremia  The various methods of treatment have been discussed with the patient and family. After consideration of risks, benefits and other options for treatment, the patient has consented to  Procedure(s): TRANSESOPHAGEAL ECHOCARDIOGRAM (TEE) (N/A) as a surgical intervention .  The patient's history has been reviewed, patient examined, no change in status, stable for surgery.  I have reviewed the patient's chart and labs.  Questions were answered to the patient's satisfaction.     [name]

## 2018-10-05 NOTE — Consult Note (Signed)
[facility] for Infectious Disease    Date of Admission:  10/04/2018     Total days of antibiotics 1 Vancomycin + ceftriaxone               Reason for Consult: ?Bacterial Endocarditis    Referring Provider: Doylene Canard Primary Care Provider: System, Pcp Not In   Assessment: Chriselda Leppert is a very nice 23 y.o. female in good health that suddenly experienced symptoms of abdominal cramping, epigastric pain/substernal chest pain that sounds like caused her some fear/anxiety. New EKG finding of a right bundle branch block (although there are no previous EKGs to compare to). 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 Early blood culture results show no growth of bacteria. Usually would check serial cultures over 2-3 days with high suspicion for bacterial involvement, however she has already been started on empiric antibiotics after the first set. She is going for TEE today to further assess/characterize findings seen on TTE.   Her story is not very convincing for bacterial process and would presume that her risk for ABE to be low. Also reassuring that her inflammatory markers are normal. Will follow with TEE results.   Plan: 1. TEE today  2. Continue vancomycin + ceftriaxone pending #1.    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 [MAR Hold] heparin  5,000 Units Subcutaneous Q8H  . [MAR Hold] norethindrone-ethinyl estradiol  1 tablet Oral QHS  . [MAR Hold] sodium chloride flush  3 mL Intravenous Q12H    HPI: Daleiza Bacchi is a 23 y.o. female with no significant past medical history who presented to [facility] on 10/04/18 for evaluation of worsening abdominal cramping, epigastric pain, chest discomfort and exertional shortness of breath and palpitations.   She reports that she was in normal state of health until Wednesday when she started her menstrual cycle, as expected, and experienced significant  abdominal cramping. She also experienced a few associated loose stools that is not abnormal for her at the onset of her menses. This progressed to an epigastric/chest discomfort and she began experiencing hot/cold flashes with one episode of beading sweat across forehead. This made her highly anxious and seek care at the ER. Prior to Wednesday she has been otherwise in normal state of good health. 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 She has had no travel history outside of the [place] ever and only ever travels to [place] to see family. She has no pets in her home. She does not consume any unpasteurized milk/cheeses. She does not smoke cigarettes, does not use alcohol and denies any drug use (outside marijuana). She has had some weight loss recently but with planned and intentional effort on her part. No night sweats/fevers/chills leading up to sudden symptoms Wednesday. Sees a dentist regularly and no complaints currently.   The only medication she takes regularly is a combined oral contraceptive and hydroxyzine. No history of recent or previous leg swelling, vision changes or headaches. No family history of DVT/PE. She currently at the time of exam denies any abdominal pain and reports an "ongoing heaviness" to her breathing that she wonders is more from anxiety. No nausea but just "unsettled" because she is hungry waiting for her TEE.   Hospital Course: In the ER her troponin was found to be negative (now 2 draws), normal WBC count, normal vital signs, no fevers. EKG revealed accelerated ventricular  rhythm which is a new finding for her. TTE revealed normal systolic function with EF 55-60% w/o thickened LV wall, normal diastolic filling patterns, mild dilation of LA, normal tricuspid valve and RA, small mobile vegetation (6.40m) on the posterior mitral valve leaflet but otherwise normal in structure with minimal regurgitation detected on doppler. Infectious work up thus far has  revealed negative blood cultures at < 24 hour mark, normal CRP/ESR. She has not had any antibiotics for any other reason over the last 3 months.   Review of Systems - 12 point comprehensive review Review of Systems  All other systems reviewed and are negative.   History reviewed. No pertinent past medical history.  Social History   Tobacco Use  . Smoking status: Never Smoker  . Smokeless tobacco: Never Used  Substance Use Topics  . Alcohol use: Yes    Comment: occasinally  . Drug use: Yes    Types: Marijuana    Comment: 2-3 times a week    Family History  Problem Relation Age of Onset  . Hypertension Mother   . Diabetes Mother    No Known Allergies  OBJECTIVE: Blood pressure (!) 108/58, pulse (!) 55, temperature 98.5 F (36.9 C), temperature source Oral, resp. rate 17, height '5\' 3"'  (1.6 m), weight 84.2 kg, last menstrual period 10/04/2018, SpO2 100 %.  Physical Exam Vitals signs and nursing note reviewed.  Constitutional:      Appearance: She is well-developed.     Comments: Sitting upright in bed typing on computer. Her father is in the room.   HENT:     Mouth/Throat:     Mouth: No oral lesions.     Dentition: Normal dentition. No dental abscesses.     Pharynx: No oropharyngeal exudate.  Eyes:     Pupils: Pupils are equal, round, and reactive to light.  Cardiovascular:     Pulses:          Carotid pulses are 2+ on the right side and 2+ on the left side.      Radial pulses are 2+ on the right side and 2+ on the left side.       Dorsalis pedis pulses are 2+ on the right side and 2+ on the left side.       Posterior tibial pulses are 2+ on the right side and 2+ on the left side.     Heart sounds: Normal heart sounds. No murmur. No S3 sounds.      Comments: RBBB observed on telemetry, bradycardia with rate in 50s  Pulmonary:     Effort: Pulmonary effort is normal. No accessory muscle usage or respiratory distress.     Breath sounds: Normal breath sounds.    Abdominal:     General: There is no distension or abdominal bruit.     Palpations: Abdomen is soft. There is no splenomegaly.     Tenderness: There is no abdominal tenderness.  Musculoskeletal:     Right lower leg: No edema.     Left lower leg: No edema.  Lymphadenopathy:     Cervical: No cervical adenopathy.  Skin:    General: Skin is warm and dry.     Capillary Refill: Capillary refill takes less than 2 seconds.     Coloration: Skin is not mottled or pale.     Findings: No rash.     Comments: No osler's nodes or janaway lesions. Acrylic nails in place.   Neurological:     Mental Status: She is alert  and oriented to person, place, and time.  Psychiatric:        Judgment: Judgment normal.     Comments: In good spirits today and engaged in care discussion     Lab Results Lab Results  Component Value Date   WBC 8.6 10/04/2018   HGB 13.6 10/04/2018   HCT 42.4 10/04/2018   MCV 82.5 10/04/2018   PLT 221 10/04/2018    Lab Results  Component Value Date   CREATININE 0.66 10/05/2018   BUN 5 (L) 10/05/2018   NA 138 10/05/2018   K 3.8 10/05/2018   CL 108 10/05/2018   CO2 21 (L) 10/05/2018   No results found for: ALT, AST, GGT, ALKPHOS, BILITOT   Microbiology: Recent Results (from the past 240 hour(s))  Culture, blood (Routine X 2) w Reflex to ID Panel     Status: None (Preliminary result)   Collection Time: 10/04/18  3:30 PM  Result Value Ref Range Status   Specimen Description   Final    BLOOD RIGHT HAND Performed at [facility], [place] [address], [place], [place] [address]    Special Requests   Final    BOTTLES DRAWN AEROBIC ONLY Blood Culture adequate volume Performed at [facility] [address] [place], [place] [address]    Culture   Final    NO GROWTH < 24 HOURS Performed at [facility] [address], [place], [place] [address]    Report Status PENDING  Incomplete  Culture, blood (Routine X 2) w Reflex to ID  Panel     Status: None (Preliminary result)   Collection Time: 10/04/18  3:32 PM  Result Value Ref Range Status   Specimen Description   Final    BLOOD RIGHT ANTECUBITAL Performed at [facility] [address] [place], [place] [address]    Special Requests   Final    BOTTLES DRAWN AEROBIC AND ANAEROBIC Blood Culture adequate volume Performed at [facility] [address], [place], [place] [address]    Culture   Final    NO GROWTH < 24 HOURS Performed at [facility] [address] [place], [place] [address]    Report Status PENDING  Incomplete    Janene Madeira, MSN, NP-C [facility] for Infectious [facility] Cell: [phone] Pager: [phone]  10/05/2018 11:31 AM

## 2018-10-05 NOTE — Transfer of Care (Signed)
Immediate Anesthesia Transfer of Care Note  Patient: Pamela Maynard  Procedure(s) Performed: TRANSESOPHAGEAL ECHOCARDIOGRAM (TEE) (N/A ) BUBBLE STUDY  Patient Location: Endoscopy Unit  Anesthesia Type:MAC  Level of Consciousness: awake, alert  and oriented  Airway & Oxygen Therapy: Patient Spontanous Breathing and Patient connected to nasal cannula oxygen  Post-op Assessment: Report given to RN and Post -op Vital signs reviewed and stable  Post vital signs: Reviewed and stable  Last Vitals:  Vitals Value Taken Time  BP    Temp    Pulse 78 10/05/2018 12:44 PM  Resp 12 10/05/2018 12:44 PM  SpO2 98 % 10/05/2018 12:44 PM  Vitals shown include unvalidated device data.  Last Pain:  Vitals:   10/05/18 1244  TempSrc:   PainSc: 0-No pain         Complications: No apparent anesthesia complications

## 2018-10-05 NOTE — Anesthesia Procedure Notes (Signed)
Procedure Name: MAC Date/Time: 10/05/2018 12:03 PM Performed by: Candis Shine, CRNA Pre-anesthesia Checklist: Patient identified, Emergency Drugs available, Suction available, Patient being monitored and Timeout performed Patient Re-evaluated:Patient Re-evaluated prior to induction Oxygen Delivery Method: Nasal cannula Dental Injury: Teeth and Oropharynx as per pre-operative assessment

## 2018-10-05 NOTE — H&P (View-Only) (Signed)
Ref: System, Pcp Not In   Subjective:  Had anxiety and chest pain overnight.  Objective:  Vital Signs in the last 24 hours: Temp:  [97.8 F (36.6 C)-98.6 F (37 C)] 98.4 F (36.9 C) (01/31 0503) Pulse Rate:  [53-92] 53 (01/31 0503) Cardiac Rhythm: Sinus bradycardia;Other (Comment) (01/31 0700) Resp:  [14-25] 18 (01/30 1948) BP: (107-162)/(63-87) 126/82 (01/31 0503) SpO2:  [98 %-100 %] 100 % (01/31 0503) Weight:  [84.1 kg-85.3 kg] 84.2 kg (01/31 0400)  Physical Exam: BP Readings from Last 1 Encounters:  10/05/18 126/82     Wt Readings from Last 1 Encounters:  10/05/18 84.2 kg    Weight change:  Body mass index is 32.88 kg/m. HEENT: [place]/AT, Eyes-Brown, PERL, EOMI, Conjunctiva-Pink, Sclera-Non-icteric Neck: No JVD, No bruit, Trachea midline. Lungs:  Clear, Bilateral. Cardiac:  Regular rhythm, normal S1 and S2, no S3. II/VI apical systolic murmur. Abdomen:  Soft, non-tender. BS present. Extremities:  No edema present. No cyanosis. No clubbing. CNS: AxOx3, Cranial nerves grossly intact, moves all 4 extremities.  Skin: Warm and dry.   Intake/Output from previous day: 01/30 0701 - 01/31 0700 In: 343 [P.O.:240; I.V.:3; IV Piggyback:100] Out: 450 [Urine:450]    Lab Results: BMET    Component Value Date/Time   NA 138 10/05/2018 0630   NA 138 10/04/2018 1120   K 3.8 10/05/2018 0630   K 3.6 10/04/2018 1120   CL 108 10/05/2018 0630   CL 104 10/04/2018 1120   CO2 21 (L) 10/05/2018 0630   CO2 22 10/04/2018 1120   GLUCOSE 99 10/05/2018 0630   GLUCOSE 97 10/04/2018 1120   BUN 5 (L) 10/05/2018 0630   BUN 10 10/04/2018 1120   CREATININE 0.66 10/05/2018 0630   CREATININE 0.75 10/04/2018 1120   CALCIUM 9.0 10/05/2018 0630   CALCIUM 9.7 10/04/2018 1120   GFRNONAA >60 10/05/2018 0630   GFRNONAA >60 10/04/2018 1120   GFRAA >60 10/05/2018 0630   GFRAA >60 10/04/2018 1120   CBC    Component Value Date/Time   WBC 8.6 10/04/2018 1120   RBC 5.14 (H) 10/04/2018 1120   HGB  13.6 10/04/2018 1120   HCT 42.4 10/04/2018 1120   PLT 221 10/04/2018 1120   MCV 82.5 10/04/2018 1120   MCH 26.5 10/04/2018 1120   MCHC 32.1 10/04/2018 1120   RDW 14.2 10/04/2018 1120   HEPATIC Function Panel No results for input(s): PROT in the last 8760 hours.  Invalid input(s):  ALBUMIN,  AST,  ALT,  ALKPHOS,  BILIDIR,  IBILI HEMOGLOBIN A1C No components found for: HGA1C,  MPG CARDIAC ENZYMES Lab Results  Component Value Date   TROPONINI <0.03 10/04/2018   BNP No results for input(s): PROBNP in the last 8760 hours. TSH Recent Labs    10/04/18 1120  TSH 3.072   CHOLESTEROL No results for input(s): CHOL in the last 8760 hours.  Scheduled Meds: . heparin  5,000 Units Subcutaneous Q8H  . norethindrone-ethinyl estradiol  1 tablet Oral QHS  . sodium chloride flush  3 mL Intravenous Q12H   Continuous Infusions: . sodium chloride    . cefTRIAXone (ROCEPHIN)  IV Stopped (10/04/18 1830)  . vancomycin 1,250 mg (10/05/18 0508)   PRN Meds:.sodium chloride, acetaminophen, hydrOXYzine, sodium chloride flush  Assessment/Plan: Acute MV endocarditis Chest pain Shortness of breath Palpitation Idioventricular rhythm, resolving  Continue IV antibiotics TEE today if scheduling allows.   LOS: 1 day    Orpah Cobb  MD  10/05/2018, 9:27 AM

## 2018-10-05 NOTE — Anesthesia Preprocedure Evaluation (Signed)
Anesthesia Evaluation  Patient identified by MRN, date of birth, ID band Patient awake    Reviewed: Allergy & Precautions, H&P , NPO status , Patient's Chart, lab work & pertinent test results, reviewed documented beta blocker date and time   Airway Mallampati: II  TM Distance: >3 FB Neck ROM: full    Dental no notable dental hx.    Pulmonary neg pulmonary ROS,    Pulmonary exam normal breath sounds clear to auscultation       Cardiovascular Exercise Tolerance: Good negative cardio ROS   Rhythm:regular Rate:Normal     Neuro/Psych negative neurological ROS  negative psych ROS   GI/Hepatic negative GI ROS, Neg liver ROS,   Endo/Other  negative endocrine ROS  Renal/GU negative Renal ROS  negative genitourinary   Musculoskeletal   Abdominal   Peds  Hematology negative hematology ROS (+)   Anesthesia Other Findings   Reproductive/Obstetrics negative OB ROS                             Anesthesia Physical Anesthesia Plan  ASA: II  Anesthesia Plan: MAC   Post-op Pain Management:    Induction: Intravenous  PONV Risk Score and Plan:   Airway Management Planned: Nasal Cannula, Simple Face Mask and Mask  Additional Equipment:   Intra-op Plan:   Post-operative Plan:   Informed Consent: I have reviewed the patients History and Physical, chart, labs and discussed the procedure including the risks, benefits and alternatives for the proposed anesthesia with the patient or authorized representative who has indicated his/her understanding and acceptance.     Dental Advisory Given  Plan Discussed with: CRNA, Anesthesiologist and Surgeon  Anesthesia Plan Comments:         Anesthesia Quick Evaluation

## 2018-10-05 NOTE — Progress Notes (Signed)
[facility]  [facility] Infusion Coordinator will follow pt with ID team to support infusion pharmacy services if IV ABX needed at DC to home.  If patient discharges after hours, please call [phone].   Sedalia Muta 10/05/2018, 6:48 AM

## 2018-10-05 NOTE — Progress Notes (Signed)
Ref: System, Pcp Not In   Subjective:  Had anxiety and chest pain overnight.  Objective:  Vital Signs in the last 24 hours: Temp:  [97.8 F (36.6 C)-98.6 F (37 C)] 98.4 F (36.9 C) (01/31 0503) Pulse Rate:  [53-92] 53 (01/31 0503) Cardiac Rhythm: Sinus bradycardia;Other (Comment) (01/31 0700) Resp:  [14-25] 18 (01/30 1948) BP: (107-162)/(63-87) 126/82 (01/31 0503) SpO2:  [98 %-100 %] 100 % (01/31 0503) Weight:  [84.1 kg-85.3 kg] 84.2 kg (01/31 0400)  Physical Exam: BP Readings from Last 1 Encounters:  10/05/18 126/82     Wt Readings from Last 1 Encounters:  10/05/18 84.2 kg    Weight change:  Body mass index is 32.88 kg/m. HEENT: [place]/AT, Eyes-Brown, PERL, EOMI, Conjunctiva-Pink, Sclera-Non-icteric Neck: No JVD, No bruit, Trachea midline. Lungs:  Clear, Bilateral. Cardiac:  Regular rhythm, normal S1 and S2, no S3. II/VI apical systolic murmur. Abdomen:  Soft, non-tender. BS present. Extremities:  No edema present. No cyanosis. No clubbing. CNS: AxOx3, Cranial nerves grossly intact, moves all 4 extremities.  Skin: Warm and dry.   Intake/Output from previous day: 01/30 0701 - 01/31 0700 In: 343 [P.O.:240; I.V.:3; IV Piggyback:100] Out: 450 [Urine:450]    Lab Results: BMET    Component Value Date/Time   NA 138 10/05/2018 0630   NA 138 10/04/2018 1120   K 3.8 10/05/2018 0630   K 3.6 10/04/2018 1120   CL 108 10/05/2018 0630   CL 104 10/04/2018 1120   CO2 21 (L) 10/05/2018 0630   CO2 22 10/04/2018 1120   GLUCOSE 99 10/05/2018 0630   GLUCOSE 97 10/04/2018 1120   BUN 5 (L) 10/05/2018 0630   BUN 10 10/04/2018 1120   CREATININE 0.66 10/05/2018 0630   CREATININE 0.75 10/04/2018 1120   CALCIUM 9.0 10/05/2018 0630   CALCIUM 9.7 10/04/2018 1120   GFRNONAA >60 10/05/2018 0630   GFRNONAA >60 10/04/2018 1120   GFRAA >60 10/05/2018 0630   GFRAA >60 10/04/2018 1120   CBC    Component Value Date/Time   WBC 8.6 10/04/2018 1120   RBC 5.14 (H) 10/04/2018 1120   HGB  13.6 10/04/2018 1120   HCT 42.4 10/04/2018 1120   PLT 221 10/04/2018 1120   MCV 82.5 10/04/2018 1120   MCH 26.5 10/04/2018 1120   MCHC 32.1 10/04/2018 1120   RDW 14.2 10/04/2018 1120   HEPATIC Function Panel No results for input(s): PROT in the last 8760 hours.  Invalid input(s):  ALBUMIN,  AST,  ALT,  ALKPHOS,  BILIDIR,  IBILI HEMOGLOBIN A1C No components found for: HGA1C,  MPG CARDIAC ENZYMES Lab Results  Component Value Date   TROPONINI <0.03 10/04/2018   BNP No results for input(s): PROBNP in the last 8760 hours. TSH Recent Labs    10/04/18 1120  TSH 3.072   CHOLESTEROL No results for input(s): CHOL in the last 8760 hours.  Scheduled Meds: . heparin  5,000 Units Subcutaneous Q8H  . norethindrone-ethinyl estradiol  1 tablet Oral QHS  . sodium chloride flush  3 mL Intravenous Q12H   Continuous Infusions: . sodium chloride    . cefTRIAXone (ROCEPHIN)  IV Stopped (10/04/18 1830)  . vancomycin 1,250 mg (10/05/18 0508)   PRN Meds:.sodium chloride, acetaminophen, hydrOXYzine, sodium chloride flush  Assessment/Plan: Acute MV endocarditis Chest pain Shortness of breath Palpitation Idioventricular rhythm, resolving  Continue IV antibiotics TEE today if scheduling allows.   LOS: 1 day    Deonne Rooks  MD  10/05/2018, 9:27 AM     

## 2018-10-05 NOTE — CV Procedure (Signed)
INDICATIONS:   The patient is 23 year old female with MV vegetation.  PROCEDURE:  Informed consent was discussed including risks, benefits and alternatives for the procedure.  Risks include, but are not limited to, cough, sore throat, vomiting, nausea, somnolence, esophageal and stomach trauma or perforation, bleeding, low blood pressure, aspiration, pneumonia, infection, trauma to the teeth and death.    Patient was given sedation.  The oropharynx was anesthetized with topical lidocaine.  The transesophageal probe was inserted in the esophagus and stomach and multiple views were obtained.  Agitated saline was used after the transesophageal probe was removed from the body.  The patient was kept under observation until the patient left the procedure room.  The patient left the procedure room in stable condition.   COMPLICATIONS:  There were no immediate complications.  FINDINGS:  1. LEFT VENTRICLE: The left ventricle is normal in structure and function.  Wall motion is normal.  No thrombus or masses seen in the left ventricle.  2. RIGHT VENTRICLE:  The right ventricle is normal in structure and function without any thrombus or masses.    3. LEFT ATRIUM:  The left atrium is normal without any thrombus or masses.  4. LEFT ATRIAL APPENDAGE:  The left atrial appendage is free of any thrombus or masses.  5. RIGHT ATRIUM:  The right atrium is free of any thrombus or masses.    6. ATRIAL SEPTUM:  The atrial septum is normal without any ASD or PFO with sonicated saline injection.  7. MITRAL VALVE:  The mitral valve is normal in structure and function without significant regurgitation, no masses, no stenosis. Posterior leaflet MV mobile Vegetations and possible anterior leaflet non-mobile vegetation with minimal MR when study was amended with TTS images.  8. TRICUSPID VALVE:  The tricuspid valve is normal in structure and function with minimal regurgitation, no masses, stenosis or  vegetations.  9. AORTIC VALVE:  The aortic valve is normal in structure and function without regurgitation, masses, stenosis or vegetations.  10. PULMONIC VALVE:  The pulmonic valve is normal in structure and function without regurgitation, masses, stenosis or vegetations.  11. AORTIC ARCH, ASCENDING AND DESCENDING AORTA:  The aorta had no atherosclerosis in the ascending or descending aorta.  The aortic arch was normal.  12.  Superior Vena Cava : No thrombus or catheter.  13.  Pulmonary Veins: Visible.  14.  Pulmonary artery: visible and normal.   IMPRESSION:   1. MV vegetations. 2. Normal LV systolic function. 3. No PFO.  RECOMMENDATIONS:    Continue IV antibiotics.Marland Kitchen

## 2018-10-05 NOTE — Anesthesia Postprocedure Evaluation (Signed)
Anesthesia Post Note  Patient: Pamela Maynard  Procedure(s) Performed: TRANSESOPHAGEAL ECHOCARDIOGRAM (TEE) (N/A ) BUBBLE STUDY     Patient location during evaluation: PACU Anesthesia Type: MAC Level of consciousness: awake and alert Pain management: pain level controlled Vital Signs Assessment: post-procedure vital signs reviewed and stable Respiratory status: spontaneous breathing, nonlabored ventilation, respiratory function stable and patient connected to nasal cannula oxygen Cardiovascular status: stable and blood pressure returned to baseline Postop Assessment: no apparent nausea or vomiting Anesthetic complications: no    Last Vitals:  Vitals:   10/05/18 1244 10/05/18 1250  BP: 138/76 (!) 146/78  Pulse: 73 (!) 48  Resp: 10 (!) 23  Temp:    SpO2: 98% 98%    Last Pain:  Vitals:   10/05/18 1422  TempSrc:   PainSc: 3                  Pearly [place]

## 2018-10-06 ENCOUNTER — Encounter (HOSPITAL_COMMUNITY): Payer: Self-pay | Admitting: Cardiovascular Disease

## 2018-10-06 LAB — TROPONIN I: Troponin I: <0.03 ng/mL (ref <0.03)

## 2018-10-06 MED ORDER — ONDANSETRON HCL 4 MG PO TABS: 4.0000 mg | ORAL_TABLET | Freq: Once | ORAL | Status: DC

## 2018-10-06 MED ORDER — ONDANSETRON HCL 4 MG PO TABS
4.0000 mg | ORAL_TABLET | Freq: Three times a day (TID) | ORAL | Status: DC | PRN
  Administered 2018-10-06 – 2018-10-07 (×2): 4 mg via ORAL
  Filled 2018-10-06 (×2): qty 1

## 2018-10-06 NOTE — Progress Notes (Signed)
Subjective:  Complains of occasional nausea.  Denies any chest pain states breathing is improved overall feels better  Objective:  Vital Signs in the last 24 hours: Temp:  [98 F (36.7 C)-98.5 F (36.9 C)] 98 F (36.7 C) (02/01 0521) Pulse Rate:  [48-73] 50 (02/01 0521) Resp:  [10-23] 18 (02/01 0521) BP: (108-146)/(58-78) 140/76 (02/01 0521) SpO2:  [98 %-100 %] 100 % (02/01 0521) Weight:  [84.6 kg] 84.6 kg (02/01 0521)  Intake/Output from previous day: 01/31 0701 - 02/01 0700 In: 1725.5 [P.O.:600; I.V.:875.5; IV Piggyback:250] Out: 2 [Urine:2] Intake/Output from this shift: No intake/output data recorded.  Physical Exam: Neck: no adenopathy, no carotid bruit, no JVD and supple, symmetrical, trachea midline Lungs: clear to auscultation bilaterally Heart: regular rate and rhythm and 2/6 systolic murmur noted Abdomen: soft, non-tender; bowel sounds normal; no masses,  no organomegaly Extremities: extremities normal, atraumatic, no cyanosis or edema  Lab Results: Recent Labs    10/04/18 1120  WBC 8.6  HGB 13.6  PLT 221   Recent Labs    10/04/18 1120 10/05/18 0630  NA 138 138  K 3.6 3.8  CL 104 108  CO2 22 21*  GLUCOSE 97 99  BUN 10 5*  CREATININE 0.75 0.66   Recent Labs    10/05/18 2201 10/06/18 0253  TROPONINI <0.03 <0.03   Hepatic Function Panel No results for input(s): PROT, ALBUMIN, AST, ALT, ALKPHOS, BILITOT, BILIDIR, IBILI in the last 72 hours. No results for input(s): CHOL in the last 72 hours. No results for input(s): PROTIME in the last 72 hours.  Imaging: Imaging results have been reviewed and Dg Chest Portable 1 View  Result Date: 10/04/2018 CLINICAL DATA:  Chest pain for 2 days with shortness of breath EXAM: PORTABLE CHEST 1 VIEW COMPARISON:  None. FINDINGS: The heart size and mediastinal contours are within normal limits. Both lungs are clear. The visualized skeletal structures are unremarkable. IMPRESSION: No active disease. Electronically  Signed   By: Alcide Clever M.D.   On: 10/04/2018 12:49    Cardiac Studies:  Assessment/Plan:  Acute mitral valve endocarditis Status post junctional rhythm with right bundle branch block Plan Continue present management Check blood cultures  LOS: 2 days    Rinaldo Cloud 10/06/2018, 9:33 AM

## 2018-10-06 NOTE — Progress Notes (Signed)
Verbal order from MD [facility] for Zofran 4 mg, will enter order Stanford Breed RN 1:02 PM 10-06-2018

## 2018-10-07 LAB — BASIC METABOLIC PANEL
Anion gap: 12 (ref 5–15)
CO2: 23 mmol/L (ref 22–32)
CREATININE: 0.68 mg/dL (ref 0.44–1.00)
Calcium: 9.4 mg/dL (ref 8.9–10.3)
Chloride: 105 mmol/L (ref 98–111)
GFR calc Af Amer: >60 mL/min (ref >60)
GFR calc non Af Amer: >60 mL/min (ref >60)
Glucose, Bld: 91 mg/dL (ref 70–99)
Potassium: 3.6 mmol/L (ref 3.5–5.1)
Sodium: 140 mmol/L (ref 135–145)

## 2018-10-07 LAB — VANCOMYCIN, RANDOM: Vancomycin Rm: 44

## 2018-10-07 LAB — VANCOMYCIN, TROUGH: Vancomycin Tr: 13 ug/mL — ABNORMAL LOW (ref 15–20)

## 2018-10-07 MED ORDER — HYDROCORTISONE 1 % EX CREA
TOPICAL_CREAM | Freq: Three times a day (TID) | CUTANEOUS | Status: DC | PRN
  Filled 2018-10-07: qty 28

## 2018-10-07 NOTE — Progress Notes (Signed)
Subjective:  Patient denies any chest pain or shortness of breath.  States overall feels better.  Blood cultures so far has been negative.  Objective:  Vital Signs in the last 24 hours: Temp:  [98.1 F (36.7 C)-98.7 F (37.1 C)] 98.7 F (37.1 C) (02/02 0449) Pulse Rate:  [49-63] 63 (02/02 0449) Resp:  [18-20] 18 (02/02 0449) BP: (122-134)/(56-77) 122/69 (02/02 0449) SpO2:  [96 %-100 %] 100 % (02/02 0449) Weight:  [83.8 kg] 83.8 kg (02/02 0300)  Intake/Output from previous day: 02/01 0701 - 02/02 0700 In: 1798 [P.O.:600; I.V.:948; IV Piggyback:250] Out: 0  Intake/Output from this shift: No intake/output data recorded.  Physical Exam: Neck: no adenopathy, no carotid bruit, no JVD and supple, symmetrical, trachea midline Lungs: clear to auscultation bilaterally Heart: regular rate and rhythm, S1, S2 normal and 2/6 systolic murmur noted Abdomen: soft, non-tender; bowel sounds normal; no masses,  no organomegaly Extremities: extremities normal, atraumatic, no cyanosis or edema  Lab Results: Recent Labs    10/04/18 1120  WBC 8.6  HGB 13.6  PLT 221   Recent Labs    10/04/18 1120 10/05/18 0630  NA 138 138  K 3.6 3.8  CL 104 108  CO2 22 21*  GLUCOSE 97 99  BUN 10 5*  CREATININE 0.75 0.66   Recent Labs    10/05/18 2201 10/06/18 0253  TROPONINI <0.03 <0.03   Hepatic Function Panel No results for input(s): PROT, ALBUMIN, AST, ALT, ALKPHOS, BILITOT, BILIDIR, IBILI in the last 72 hours. No results for input(s): CHOL in the last 72 hours. No results for input(s): PROTIME in the last 72 hours.  Imaging: Imaging results have been reviewed and No results found.  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

## 2018-10-08 ENCOUNTER — Inpatient Hospital Stay: Payer: Self-pay

## 2018-10-08 DIAGNOSIS — Z0389 Encounter for observation for other suspected diseases and conditions ruled out: Secondary | ICD-10-CM

## 2018-10-08 LAB — VANCOMYCIN, TROUGH: Vancomycin Tr: 9 ug/mL — ABNORMAL LOW (ref 15–20)

## 2018-10-08 MED ORDER — VANCOMYCIN HCL IN DEXTROSE 1-5 GM/200ML-% IV SOLN
1000.0000 mg | Freq: Two times a day (BID) | INTRAVENOUS | Status: DC
  Administered 2018-10-08: 1000 mg via INTRAVENOUS
  Filled 2018-10-08 (×2): qty 200

## 2018-10-08 MED ORDER — VANCOMYCIN IV (FOR PTA / DISCHARGE USE ONLY): 1000.0000 mg | Freq: Two times a day (BID) | INTRAVENOUS | 0 refills | Status: AC

## 2018-10-08 MED ORDER — CEFTRIAXONE IV (FOR PTA / DISCHARGE USE ONLY): 2.0000 g | INTRAVENOUS | 0 refills | Status: AC

## 2018-10-08 MED ORDER — SODIUM CHLORIDE 0.9% FLUSH
10.0000 mL | INTRAVENOUS | Status: DC | PRN
  Administered 2018-10-08: 10 mL
  Filled 2018-10-08: qty 40

## 2018-10-08 MED ORDER — HEPARIN SOD (PORK) LOCK FLUSH 100 UNIT/ML IV SOLN
250.0000 [IU] | INTRAVENOUS | Status: AC | PRN
  Administered 2018-10-08: 250 [IU]

## 2018-10-08 MED ORDER — SODIUM CHLORIDE 0.9% FLUSH: 10.0000 mL | Freq: Two times a day (BID) | INTRAVENOUS | Status: DC

## 2018-10-08 NOTE — Progress Notes (Signed)
Peripherally Inserted Central Catheter/Midline Placement  The IV Nurse has discussed with the patient and/or persons authorized to consent for the patient, the purpose of this procedure and the potential benefits and risks involved with this procedure.  The benefits include less needle sticks, lab draws from the catheter, and the patient may be discharged home with the catheter. Risks include, but not limited to, infection, bleeding, blood clot (thrombus formation), and puncture of an artery; nerve damage and irregular heartbeat and possibility to perform a PICC exchange if needed/ordered by physician.  Alternatives to this procedure were also discussed.  Bard Power PICC patient education guide, fact sheet on infection prevention and patient information card has been provided to patient /or left at bedside.    PICC/Midline Placement Documentation  PICC Single Lumen 10/08/18 PICC Right Cephalic 38 cm 0 cm (Active)  Indication for Insertion or Continuance of Line Home intravenous therapies (PICC only) 10/08/2018  2:52 PM  Exposed Catheter (cm) 0 cm 10/08/2018  2:52 PM  Site Assessment Clean;Dry;Intact 10/08/2018  2:52 PM  Line Status Flushed;Saline locked;Blood return noted 10/08/2018  2:52 PM  Dressing Type Transparent 10/08/2018  2:52 PM  Dressing Status Clean;Dry;Intact;Antimicrobial disc in place 10/08/2018  2:52 PM  Dressing Change Due 10/15/18 10/08/2018  2:52 PM       Ethelda Chick 10/08/2018, 2:53 PM

## 2018-10-08 NOTE — Progress Notes (Addendum)
  Pharmacy Antibiotic Note  Pamela Maynard is a 23 y.o. 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 SCr 0.68. Levels to assess AUC were drawn - the AUC returned around 550. I tried to get her on a q 24 hour regimen however, the AUC will be subtherapeutic with a 24 hour regimen.     Plan: Continue vancomycin 1250mg  IV q12h Continue ceftriaxone 2 g IV 24h Please obtain a vancomycin trough or AUC once/week Monitor renal function   Addendum: Will actually go ahead and lower dose to 1 gm Q 12 hours. Current dosing gives you an AUC of 557, which is slightly above goal. Vancomycin 1 gm Q 12 hours will give predicted AUC of ~540.   Pamela Maynard, PharmD, BCPS, BCIDP Infectious Diseases Clinical Pharmacist Phone: [phone]    Height: 5\' 3"  (160 cm) Weight: 183 lb 11.2 oz (83.3 kg) IBW/kg (Calculated) : 52.4  Temp (24hrs), Avg:98.6 F (37 C), Min:98.4 F (36.9 C), Max:99 F (37.2 C)  Recent Labs  Lab 10/04/18 1120 10/05/18 0630 10/07/18 1208 10/07/18 2120 10/08/18 0825  WBC 8.6  --   --   --   --   CREATININE 0.75 0.66 0.68  --   --   VANCOTROUGH  --   --   --  13* 9*  VANCORANDOM  --   --  44  --   --      Antimicrobials this admission: 1/30 Vancomycin >> 1/30 Ceftriaxone >>  Dose adjustments this admission: N/A  Microbiology results: 1/30 BCx: ngtd   Pamela Maynard, Pamela Maynard 10/08/2018 10:59 AM

## 2018-10-08 NOTE — Progress Notes (Signed)
   10/08/18 1940  Vitals  Temp 98.8 F (37.1 C)  Temp Source Oral  BP (!) 139/96  BP Location Right Arm  Pulse Rate 78  Pulse Rate Source Dinamap  Resp 18  Oxygen Therapy  SpO2 100 %  O2 Device Room Air  MEWS Score  MEWS RR 0  MEWS Pulse 0  MEWS Systolic 0  MEWS LOC 0  MEWS Temp 0  MEWS Score 0  MEWS Score Color Green  Patient d/c'd via wheel chair with family. All belongings returned to patient.

## 2018-10-08 NOTE — Progress Notes (Signed)
Subjective: No new complaints   Antibiotics:  Anti-infectives (From admission, onward)   Start     Dose/Rate Route Frequency Ordered Stop   10/05/18 0600  vancomycin (VANCOCIN) 1,250 mg in sodium chloride 0.9 % 250 mL IVPB     1,250 mg 166.7 mL/hr over 90 Minutes Intravenous Every 12 hours 10/04/18 1701     10/04/18 1700  cefTRIAXone (ROCEPHIN) 2 g in sodium chloride 0.9 % 100 mL IVPB     2 g 200 mL/hr over 30 Minutes Intravenous Every 24 hours 10/04/18 1634     10/04/18 1700  vancomycin (VANCOCIN) 2,000 mg in sodium chloride 0.9 % 500 mL IVPB     2,000 mg 250 mL/hr over 120 Minutes Intravenous STAT 10/04/18 1646 10/04/18 2108      Medications: Scheduled Meds: . heparin  5,000 Units Subcutaneous Q8H  . norethindrone-ethinyl estradiol  1 tablet Oral QHS  . sodium chloride flush  3 mL Intravenous Q12H   Continuous Infusions: . sodium chloride 250 mL (10/06/18 0507)  . sodium chloride 75 mL/hr at 10/07/18 2053  . cefTRIAXone (ROCEPHIN)  IV 2 g (10/07/18 1725)  . vancomycin 1,250 mg (10/07/18 2100)   PRN Meds:.sodium chloride, acetaminophen, hydrocortisone cream, hydrOXYzine, ondansetron, sodium chloride flush    Objective: Weight change: -0.454 kg  Intake/Output Summary (Last 24 hours) at 10/08/2018 1129 Last data filed at 10/08/2018 0700 Gross per 24 hour  Intake 1871.49 ml  Output 3500 ml  Net -1628.51 ml   Blood pressure (!) 144/80, pulse (!) 50, temperature 98.4 F (36.9 C), temperature source Oral, resp. rate 18, height 5\' 3"  (1.6 m), weight 83.3 kg, last menstrual period 10/04/2018, SpO2 99 %. Temp:  [98.4 F (36.9 C)-99 F (37.2 C)] 98.4 F (36.9 C) (02/03 0517) Pulse Rate:  [50-51] 50 (02/03 0517) Resp:  [18] 18 (02/03 0517) BP: (129-144)/(80) 144/80 (02/03 0517) SpO2:  [99 %-100 %] 99 % (02/03 0517) Weight:  [83.3 kg] 83.3 kg (02/03 0517)  Physical Exam: General: Alert and awake, oriented x3, not in any acute distress. HEENT: anicteric  sclera, EOMI, she has 1 upper tooth that never fully descended no of obvious dental abscess or infection CVS regular rate, normal , murmurs or gallops or rubs heard today when I listen to her Chest: , no wheezing, no respiratory distress, clear to auscultation bilaterally Abdomen: soft non-distended,  Extremities: no edema or deformity noted bilaterally Skin: no rashes Neuro: nonfocal  CBC:    BMET Recent Labs    10/07/18 1208  NA 140  K 3.6  CL 105  CO2 23  GLUCOSE 91  BUN <5*  CREATININE 0.68  CALCIUM 9.4     Liver Panel  No results for input(s): PROT, ALBUMIN, AST, ALT, ALKPHOS, BILITOT, BILIDIR, IBILI in the last 72 hours.     Sedimentation Rate No results for input(s): ESRSEDRATE in the last 72 hours. C-Reactive Protein No results for input(s): CRP in the last 72 hours.  Micro Results: Recent Results (from the past 720 hour(s))  Culture, blood (Routine X 2) w Reflex to ID Panel     Status: None (Preliminary result)   Collection Time: 10/04/18  3:30 PM  Result Value Ref Range Status   Specimen Description   Final    BLOOD RIGHT HAND Performed at [facility], [address] [place], [place] [address]    Special Requests   Final    BOTTLES DRAWN AEROBIC ONLY Blood Culture adequate volume  Performed at [facility], [address] [place], [place] [address]    Culture   Final    NO GROWTH 4 DAYS Performed at [facility], [address] [place], [place] [address]    Report Status PENDING  Incomplete  Culture, blood (Routine X 2) w Reflex to ID Panel     Status: None (Preliminary result)   Collection Time: 10/04/18  3:32 PM  Result Value Ref Range Status   Specimen Description   Final    BLOOD RIGHT ANTECUBITAL Performed at [facility], [address] [place], [place] [address]    Special Requests   Final    BOTTLES DRAWN AEROBIC AND ANAEROBIC Blood Culture adequate volume Performed  at [facility], [address] [place], [place] [address]    Culture   Final    NO GROWTH 4 DAYS Performed at [facility], [address] [place], [place] [address]    Report Status PENDING  Incomplete    Studies/Results: [facility]  Result Date: 10/08/2018 [facility] image not attached, placement could not be confirmed due to current cardiac rhythm.     Assessment/Plan:  INTERVAL HISTORY: Cultures remain negative   Active Problems:   Observation for suspected infectious endocarditis of mitral valve    Pamela Maynard is a 23 y.o. female with culture-negative endocarditis with 2 vegetation seen in the mitral valve  #1 Culture-negative endocarditis: This seems to be likely a true bacterial culture-negative endocarditis rather than a noninfectious thrombotic process  Plan on 6 weeks of IV vancomycin and ceftriaxone if no organisms are isolated  I have made her a spittle of a follow-up appointment with me on February 25 at 10 AM at [facility] on [address]  O  Put in a OPAT order and have placed order for PICC  I will followup her cultures but otherwise otherwise sign off for now.   LOS: 4 days   Acey LavCornelius Van Dam 10/08/2018, 11:29 AM

## 2018-10-08 NOTE — Discharge Summary (Signed)
Physician Discharge Summary  Patient ID: Pamela Maynard MRN: [id number] DOB/AGE: 02/23/1996 23 y.o.  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 Patient had intermittent junctional rhythm with RBBB appearing as accelerated idioventricular rhythm on monitor. She has right upper premolar impacted tooth with tenderness. She denies IV drug use and has no fever or chills or arthralgia. She has no skin lesions of infective endocarditis namely splinter hemorrhages under nail-bed, Janeway lesions, Osler nodes or petechiae.  Her inflammatory markers like C-reactive protein and sedimentation rate were negative. Blood cultures are negative at 4 days and will be checked for several more days. She was treated with Vancomycin 1 g bid and Rocephin 2 g. Daily and post PICC line continue home treatment for 4 to 6 weeks as determined by culture report and by infectious disease specialist. She will see me in 1 week and infectious disease doctor in 4 weeks. Patient to see her dentist in 1-2 weeks.  Consults: cardiology and ID  Significant Diagnostic Studies: labs: CBC, BMET, BNP, C-reactive protein, Sedimentation rate and Troponin I were negative. Blood cultures x 2 to 4 from different sites were negative so far.  Urine rapid drug screen was positive for Benzodiazepine and Tetrahydrocannabinol.  EKG : Junctional rhythm with RBBB to sinus rhythm with non-specific T wave abnormality..  Chest x-ray: Unremarkable.  Echocardiogram: Small vegetations on MV leaflets with normal LV systolic fucntion.  Treatments: antibiotics: vancomycin and ceftriaxone.  Discharge Exam: Blood pressure 123/66, pulse 68, temperature 98.8 F (37.1 C), temperature source Oral, resp. rate 18, height _0  (1.6 m), weight 83.3 kg, last menstrual period 10/04/2018, SpO2 100 %. General appearance: alert, cooperative and appears stated age. Head: Normocephalic, atraumatic. Mouth: Tender right upper pre-molar area. Eyes: Brown eyes, pink conjunctiva, corneas clear. PERRL, EOM's intact.  Neck: No adenopathy, no carotid bruit, no JVD, supple, symmetrical, trachea midline and thyroid not enlarged. Resp: Clear to auscultation bilaterally. Cardio: Regular rate and rhythm, S1, S2 normal, II/VI systolic murmur, no click, rub or gallop. GI: Soft, non-tender; bowel sounds normal; no organomegaly. Extremities: No edema, cyanosis or clubbing. No splinter hemorrhage or Janeway lesions. Skin: Warm and dry.  Neurologic: Alert and oriented X 3, normal strength and tone. Normal coordination and gait.  Disposition: Discharge disposition: 01-Home or Self Care       Discharge Instructions    Home infusion instructions [place] May follow [place] Dosing Protocol; May administer Cathflo as needed to maintain patency of vascular access device.; Flushing of vascular access device: per [facility] Protocol: 0.9% NaCl pre/post medica...   Complete by:  As directed    Instructions:  May follow [facility] Dosing Protocol   Instructions:  May administer Cathflo as needed to maintain patency of vascular access device.   Instructions:  Flushing of vascular access device: per [facility] Protocol: 0.9% NaCl pre/post medication administration and prn  patency;  Heparin 100 u/ml, 27m for implanted ports and Heparin 10u/ml, 560mfor all other central venous catheters.   Instructions:  May follow [facility] Anaphylaxis Protocol for First Dose Administration in the home: 0.9% NaCl at 25-50 ml/hr to maintain IV access for protocol meds. Epinephrine 0.3 ml IV/IM PRN and Benadryl 25-50 IV/IM PRN s/s of anaphylaxis.   Instructions:  [facility] Coordinator (RN) to assist per patient IV care needs in the home PRN.     Allergies as of 10/08/2018   No Known Allergies     Medication List    TAKE these medications   cefTRIAXone  IVPB Commonly known as:  ROCEPHIN Inject 2 g into the vein daily. Indication:  Culture negative endocarditis Last Day of Therapy:  11/14/2018 Labs - Once weekly:  CBC/D and BMP, Labs - Every other week:  ESR and CRP   hydrOXYzine 25 MG tablet Commonly known as:  ATARAX/VISTARIL Take 25 mg by mouth every 6 (six) hours as needed for itching.   TARINA FE 1/20 EQ 1-20 MG-MCG tablet Generic drug:  norethindrone-ethinyl estradiol Take 1 tablet by mouth daily.   vancomycin  IVPB Inject 1,000 mg into the vein every 12 (twelve) hours. Indication:  Culture negative endocarditis Last Day of Therapy:  11/14/2018 Labs - _0 /03/20 [place] up.   Why:  A home health care nurse will go to your home Contact information: [phone]        KaDixie DialsMD. Schedule an appointment as soon as possible for a visit in 1 week(s).   Specialty:  Cardiology Contact information: [phone]         Signed: AjBirdie Riddle/11/2018, 5:30 PM

## 2018-10-08 NOTE — Progress Notes (Signed)
Diagnosis: Culture-negative endocarditis  Culture Result: No growth No Known Allergies  OPAT Orders Discharge antibiotics: [place] 2 g IV daily and vancomycin Per pharmacy protocol  Aim for Vancomycin trough 15-20 (unless otherwise indicated) Duration: 6 weeks End Date: March 11th 2020  [facility] Care Per Protocol: Biweekly labs while on IV vancomycin:  _x_ BMP w GFR    weekly while on IV antibiotics: _x_ CBC with differential  _x_ Vancomycin trough _x_ CK  x__ Please pull PIC at completion of IV antibiotics __ Please leave PIC in place until doctor has seen patient or been notified  Fax weekly labs to [phone]  Clinic Follow Up Appt:  10/30/2018 at 10 am with Dr. Daiva Eves

## 2018-10-08 NOTE — Care Management Note (Signed)
Case Management Note  Patient Details  Name: Pamela Maynard MRN: [id number] Date of Birth: [birth year]  Subjective/Objective:  Infectious Endocarditis                 Action/Plan: Patient is a Consulting civil engineer at [employer], lives in apt with her roommate; PCP is Dr Algie Coffer; has private insurance with BCBS with prescription drug coverage; pharmacy of choice is [facility] on [address]; no DME; pt/ grandmother chose [facility] for home IV antibiotics; Pam Jenissa Tyrell RN with [facility] is aware, will see patient today for more teaching on home IV abx.  Expected Discharge Date: possibly 10/08/2018            Expected Discharge Plan:  Home w Home Health Services  Discharge planning Services  CM Consult  Choice offered to:  Patient, Parent  HH Arranged:  [facility]:  [facility]  Status of Service:  In process, will continue to follow  Reola Mosher [phone] 10/08/2018, 10:48 AM

## 2018-10-08 NOTE — Progress Notes (Signed)
PHARMACY CONSULT NOTE FOR:  OUTPATIENT  PARENTERAL ANTIBIOTIC THERAPY (OPAT)  Indication: Culture negative endocarditis Regimen: Ceftriaxone 2 gm every 24 hours + Vancomycin 1000 mg Q 12 hours  End date: 11/14/2018  IV antibiotic discharge orders are pended. To discharging provider:  please sign these orders via discharge navigator,  Select New Orders & click on the button choice - Manage This Unsigned Work.     Thank you for allowing pharmacy to be a part of this patient's care.  Sharin Mons, PharmD, BCPS, BCIDP Infectious Diseases Clinical Pharmacist Phone: [phone] 10/08/2018, 11:47 AM

## 2018-10-09 LAB — CULTURE, BLOOD (ROUTINE X 2)
Culture: NO GROWTH
Culture: NO GROWTH
Special Requests: ADEQUATE
Special Requests: ADEQUATE

## 2018-10-09 LAB — DRUG PROFILE, UR, 9 DRUGS (LABCORP)
Amphetamines, Urine: NEGATIVE ng/mL
BARBITURATE, UR: NEGATIVE ng/mL
Benzodiazepine Quant, Ur: NEGATIVE ng/mL
Cannabinoid Quant, Ur: POSITIVE ng/mL — AB
Cocaine (Metab.): NEGATIVE ng/mL
Methadone Screen, Urine: NEGATIVE ng/mL
Opiate Quant, Ur: NEGATIVE ng/mL
Phencyclidine, Ur: NEGATIVE ng/mL
Propoxyphene, Urine: NEGATIVE ng/mL

## 2018-10-26 ENCOUNTER — Emergency Department (HOSPITAL_COMMUNITY): Payer: BLUE CROSS/BLUE SHIELD

## 2018-10-26 ENCOUNTER — Emergency Department (HOSPITAL_COMMUNITY)
Admission: EM | Admit: 2018-10-26 | Discharge: 2018-10-26 | Disposition: A | Discharge status: Home / Self Care | Payer: BLUE CROSS/BLUE SHIELD | Attending: Emergency Medicine | Admitting: Emergency Medicine

## 2018-10-26 ENCOUNTER — Encounter (HOSPITAL_COMMUNITY): Payer: Self-pay

## 2018-10-26 ENCOUNTER — Other Ambulatory Visit: Payer: Self-pay

## 2018-10-26 DIAGNOSIS — Z79899 Other long term (current) drug therapy: Secondary | ICD-10-CM | POA: Insufficient documentation

## 2018-10-26 DIAGNOSIS — R700 Elevated erythrocyte sedimentation rate: Secondary | ICD-10-CM | POA: Diagnosis not present

## 2018-10-26 DIAGNOSIS — D649 Anemia, unspecified: Secondary | ICD-10-CM | POA: Insufficient documentation

## 2018-10-26 DIAGNOSIS — R0789 Other chest pain: Secondary | ICD-10-CM | POA: Diagnosis present

## 2018-10-26 LAB — I-STAT TROPONIN, ED: Troponin i, poc: 0 ng/mL (ref 0.00–0.08)

## 2018-10-26 LAB — BASIC METABOLIC PANEL
Anion gap: 12 (ref 5–15)
BUN: 8 mg/dL (ref 6–20)
CO2: 20 mmol/L — AB (ref 22–32)
Calcium: 9 mg/dL (ref 8.9–10.3)
Chloride: 105 mmol/L (ref 98–111)
Creatinine, Ser: 1.18 mg/dL — ABNORMAL HIGH (ref 0.44–1.00)
GFR calc Af Amer: >60 mL/min (ref >60)
GLUCOSE: 85 mg/dL (ref 70–99)
Potassium: 4.2 mmol/L (ref 3.5–5.1)
Sodium: 137 mmol/L (ref 135–145)

## 2018-10-26 LAB — IRON AND TIBC
Iron: 10 ug/dL — ABNORMAL LOW (ref 28–170)
Saturation Ratios: 5 % — ABNORMAL LOW (ref 10.4–31.8)
TIBC: 217 ug/dL — ABNORMAL LOW (ref 250–450)
UIBC: 207 ug/dL

## 2018-10-26 LAB — CBC
HCT: 32.3 % — ABNORMAL LOW (ref 36.0–46.0)
Hemoglobin: 10.1 g/dL — ABNORMAL LOW (ref 12.0–15.0)
MCH: 25.8 pg — ABNORMAL LOW (ref 26.0–34.0)
MCHC: 31.3 g/dL (ref 30.0–36.0)
MCV: 82.4 fL (ref 80.0–100.0)
Platelets: 311 10*3/uL (ref 150–400)
RBC: 3.92 MIL/uL (ref 3.87–5.11)
RDW: 13.7 % (ref 11.5–15.5)
WBC: 8.7 10*3/uL (ref 4.0–10.5)
nRBC: 0 % (ref 0.0–0.2)

## 2018-10-26 LAB — SEDIMENTATION RATE: Sed Rate: 72 mm/hr — ABNORMAL HIGH (ref 0–22)

## 2018-10-26 LAB — LACTATE DEHYDROGENASE: LDH: 141 U/L (ref 98–192)

## 2018-10-26 LAB — FERRITIN: Ferritin: 131 ng/mL (ref 11–307)

## 2018-10-26 LAB — TROPONIN I: Troponin I: <0.03 ng/mL (ref <0.03)

## 2018-10-26 LAB — C-REACTIVE PROTEIN: CRP: 10.4 mg/dL — AB (ref <1.0)

## 2018-10-26 MED ORDER — COLCHICINE 0.6 MG PO TABS: 0.6000 mg | ORAL_TABLET | Freq: Every day | ORAL | 0 refills | Status: DC

## 2018-10-26 MED ORDER — SODIUM CHLORIDE 0.9 % IV BOLUS: 1000.0000 mL | Freq: Once | INTRAVENOUS | Status: DC

## 2018-10-26 MED ORDER — SODIUM CHLORIDE 0.9 % IV SOLN
Freq: Once | INTRAVENOUS | Status: AC
  Administered 2018-10-26: 18:00:00 via INTRAVENOUS

## 2018-10-26 MED ORDER — SODIUM CHLORIDE 0.9% FLUSH: 3.0000 mL | Freq: Once | INTRAVENOUS | Status: DC

## 2018-10-26 NOTE — ED Triage Notes (Signed)
Pt reports intermittent chest pain for the past week, pt was discharged recently for a URI that she is currently getting Iv antibiotics through her PICC line at home. Pt denies chest pain at this time.

## 2018-10-26 NOTE — ED Notes (Signed)
Patient verbalizes understanding of discharge instructions. Opportunity for questioning and answers were provided. Ambulatory at discharge in NAD.  

## 2018-10-26 NOTE — Consult Note (Signed)
Referring Physician:  [name] is an 23 y.o. female.                       Chief Complaint: Chest pain  HPI: 23 years old female has left sided chest pain, intermittent, sharp, partially increased by deep breathing. EKG is normal sinus rhythm, Chest x-ray is unremarkable and Troponin I is normal. She is currently on IV vancomycin and Rocephin for suspected culture negative MV endocarditis. She admits to not eating as much as she used since she is on antibiotics.  History reviewed. No pertinent past medical history.    Past Surgical History:  Procedure Laterality Date  . TEE WITHOUT CARDIOVERSION N/A 10/05/2018   Procedure: TRANSESOPHAGEAL ECHOCARDIOGRAM (TEE);  Surgeon: Pamela Cobb, MD;  Location: [facility] ENDOSCOPY;  Service: Cardiovascular;  Laterality: N/A;  . WISDOM TOOTH EXTRACTION      Family History  Problem Relation Age of Onset  . Hypertension Mother   . Diabetes Mother    Social History:  reports that she has never smoked. She has never used smokeless tobacco. She reports current alcohol use. She reports current drug use. Drug: Marijuana.  Allergies: No Known Allergies  (Not in a hospital admission)   Results for orders placed or performed during the hospital encounter of 10/26/18 (from the past 48 hour(s))  Basic metabolic panel     Status: Abnormal   Collection Time: 10/26/18  3:28 PM  Result Value Ref Range   Sodium 137 135 - 145 mmol/L   Potassium 4.2 3.5 - 5.1 mmol/L   Chloride 105 98 - 111 mmol/L   CO2 20 (L) 22 - 32 mmol/L   Glucose, Bld 85 70 - 99 mg/dL   BUN 8 6 - 20 mg/dL   Creatinine, Ser 1.61 (H) 0.44 - 1.00 mg/dL   Calcium 9.0 8.9 - 09.6 mg/dL   GFR calc non Af Amer >60 >60 mL/min   GFR calc Af Amer >60 >60 mL/min   Anion gap 12 5 - 15    Comment: Performed at [facility], [address] [place], [place] [address]  CBC     Status: Abnormal   Collection Time: 10/26/18  3:28 PM  Result Value Ref Range   WBC 8.7 4.0 - 10.5 K/uL   RBC 3.92  3.87 - 5.11 MIL/uL   Hemoglobin 10.1 (L) 12.0 - 15.0 g/dL   HCT 98.1 (L) 19.1 - 47.8 %   MCV 82.4 80.0 - 100.0 fL   MCH 25.8 (L) 26.0 - 34.0 pg   MCHC 31.3 30.0 - 36.0 g/dL   RDW 29.5 62.1 - 30.8 %   Platelets 311 150 - 400 K/uL   nRBC 0.0 0.0 - 0.2 %    Comment: Performed at [facility], [address], [place], [place] [address]  I-stat troponin, ED     Status: None   Collection Time: 10/26/18  4:01 PM  Result Value Ref Range   Troponin i, poc 0.00 0.00 - 0.08 ng/mL   Comment 3            Comment: Due to the release kinetics of cTnI, a negative result within the first hours of the onset of symptoms does not rule out myocardial infarction with certainty. If myocardial infarction is still suspected, repeat the test at appropriate intervals.    Dg Chest 2 View  Result Date: 10/26/2018 CLINICAL DATA:  Intermittent chest pain. EXAM: CHEST - 2 VIEW COMPARISON:  10/04/2018  FINDINGS: PICC line terminates at the cavoatrial junction. Cardiomediastinal silhouette is normal. Mediastinal contours appear intact. There is no evidence of focal airspace consolidation, pleural effusion or pneumothorax. Osseous structures are without acute abnormality. Soft tissues are grossly normal. IMPRESSION: No active cardiopulmonary disease. Electronically Signed   By: Pamela Maynard M.D.   On: 10/26/2018 16:34    Review Of Systems Constitutional: No fever, chills, weight loss or gain. Eyes: No vision change, wears glasses. No discharge or pain. Ears: No hearing loss, No tinnitus. Respiratory: No asthma, COPD, pneumonias. No shortness of breath. No hemoptysis. Cardiovascular: Positive chest pain, palpitation, leg edema. Gastrointestinal: No nausea, vomiting, diarrhea, constipation. No GI bleed. No hepatitis. Genitourinary: No dysuria, hematuria, kidney stone. No incontinance. Neurological: No headache, stroke, seizures.  Psychiatry: No psych facility admission for anxiety, depression, suicide.  No detox. Skin: No rash. Musculoskeletal: No joint pain, fibromyalgia. No neck pain, back pain. Lymphadenopathy: No lymphadenopathy. Hematology: No anemia or easy bruising.   Blood pressure 131/79, pulse 83, temperature 98.4 F (36.9 C), temperature source Oral, resp. rate 16, last menstrual period 10/06/2018, SpO2 100 %. There is no height or weight on file to calculate BMI. General appearance: alert, cooperative, appears stated age and no distress Head: Normocephalic, atraumatic. Eyes: Brown eyes, pink conjunctiva, corneas clear. PERRL, EOM's intact. Neck: No adenopathy, no carotid bruit, no JVD, supple, symmetrical, trachea midline and thyroid not enlarged. Resp: Clear to auscultation bilaterally. Cardio: Regular rate and rhythm, S1, S2 normal, II/VI systolic murmur at left sternal border, no click, rub or gallop GI: Soft, non-tender; bowel sounds normal; no organomegaly. Extremities: No edema, cyanosis or clubbing. Skin: Warm and dry.  Neurologic: Alert and oriented X 3, normal strength. Normal coordination and gait.  Assessment/Plan Chest pain S/P MV endocarditis S/P IV antibiotics use Anemia, etiology unclear, r/o iron deficiency Dehydration  Check iron studies. Check LDH. Quick look echocardiogram shows normal LV systolic function without mobile vegetation on MV. OTC analgesic like Tylenol as needed. Agree with IV fluids for early dehydration. Patient agrees to eat more and drink more fluids. Follow up with Dentist and ID as arranged. F/U with me in 1 week or as needed.  Pamela Rodriguez, MD  10/26/2018, 5:10 PM

## 2018-10-26 NOTE — ED Provider Notes (Signed)
[facility] EMERGENCY DEPARTMENT Provider Note   CSN: [id number] Arrival date & time: 10/26/18  1519    History   Chief Complaint Chief Complaint  Patient presents with  . Chest Pain    HPI Pamela Maynard is a 23 y.o. female.     HPI 23 year old female here with left-sided chest pain.  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  This was a spontaneous infection with no history of drug use.  She states she had been recovering fairly well at home, until the last 2 days.  She reports that intermittently over the last 2 days, she has had a transient, quickly resolved, left-sided chest pain that wraps around her left breast.  Does not seem to be correlated with movement, deep inspiration, or palpation.  She has some associated shortness of breath.  She states that last week, she did have low-grade fevers, cough, and sputum production but thought this was due to a cold.  She has continued to use her antibiotics with out any problems.  No diarrhea.  No nausea or vomiting.  No lower extremity edema or swelling.  The pain is currently resolved.  She called Dr. Doylene Canard who advised ER evaluation.  History reviewed. No pertinent past medical history.  Patient Active Problem List   Diagnosis Date Noted  . 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 TEE WITHOUT CARDIOVERSION N/A 10/05/2018   Procedure: TRANSESOPHAGEAL ECHOCARDIOGRAM (TEE);  Surgeon: Dixie Dials, MD;  Location: [facility] ENDOSCOPY;  Service: Cardiovascular;  Laterality: N/A;  . WISDOM TOOTH EXTRACTION       OB History   No obstetric history on file.      Home Medications    Prior to Admission medications   Medication Sig Start Date End Date Taking? Authorizing Provider  cefTRIAXone (ROCEPHIN) IVPB Inject 2 g into the vein  daily. Indication:  Culture negative endocarditis Last Day of Therapy:  11/14/2018 Labs - Once weekly:  CBC/D and BMP, Labs - Every other week:  ESR and CRP 10/08/18 11/14/18  Tommy Medal, Lavell Islam, MD  colchicine 0.6 MG tablet Take 1 tablet (0.6 mg total) by mouth daily for 14 days. 10/26/18 11/09/18  Duffy Bruce, MD  hydrOXYzine (ATARAX/VISTARIL) 25 MG tablet Take 25 mg by mouth every 6 (six) hours as needed for itching.    [provider]  TARINA FE 1/20 EQ 1-20 MG-MCG tablet Take 1 tablet by mouth daily. 09/10/18   [provider]  vancomycin IVPB Inject 1,000 mg into the vein every 12 (twelve) hours. 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 Labs - Thursday:  BMP and vancomycin trough Labs - Every other week:  ESR and CRP 10/08/18 11/14/18  Tommy Medal, Lavell Islam, MD    Family History Family History  Problem Relation Age of Onset  . Hypertension Mother   . Diabetes Mother     Social History Social History   Tobacco Use  . Smoking status: Never Smoker  . Smokeless tobacco: Never Used  Substance Use Topics  . Alcohol use: Yes    Comment: occasinally  . Drug use: Yes    Types: Marijuana    Comment: 2-3 times a week     Allergies   Patient has no known allergies.   Review of Systems  Review of Systems  Constitutional: Positive for fatigue. Negative for chills and fever.  HENT: Negative for congestion and rhinorrhea.   Eyes: Negative for visual disturbance.  Respiratory: Positive for chest tightness. Negative for cough, shortness of breath and wheezing.   Cardiovascular: Positive for chest pain and palpitations. Negative for leg swelling.  Gastrointestinal: Negative for abdominal pain, diarrhea, nausea and vomiting.  Genitourinary: Negative for dysuria and flank pain.  Musculoskeletal: Negative for neck pain and neck stiffness.  Skin: Negative for rash and wound.    Allergic/Immunologic: Negative for immunocompromised state.  Neurological: Positive for weakness. Negative for syncope and headaches.  All other systems reviewed and are negative.    Physical Exam Updated Vital Signs BP 123/72   Pulse 79   Temp 98.4 F (36.9 C) (Oral)   Resp (!) 21   Ht '5\' 3"'  (1.6 m)   Wt 84.4 kg   LMP 10/06/2018 (Approximate)   SpO2 100%   BMI 32.95 kg/m   Physical Exam Vitals signs and nursing note reviewed.  Constitutional:      General: She is not in acute distress.    Appearance: She is well-developed.  HENT:     Head: Normocephalic and atraumatic.  Eyes:     Conjunctiva/sclera: Conjunctivae normal.  Neck:     Musculoskeletal: Neck supple.  Cardiovascular:     Rate and Rhythm: Normal rate and regular rhythm.     Heart sounds: S1 normal and S2 normal. Murmur present. Systolic murmur present with a grade of 2/6. No friction rub.  Pulmonary:     Effort: Pulmonary effort is normal. No respiratory distress.     Breath sounds: Normal breath sounds. No wheezing or rales.  Abdominal:     General: There is no distension.     Palpations: Abdomen is soft.     Tenderness: There is no abdominal tenderness.  Skin:    General: Skin is warm.     Capillary Refill: Capillary refill takes less than 2 seconds.  Neurological:     Mental Status: She is alert and oriented to person, place, and time.     Motor: No abnormal muscle tone.      ED Treatments / Results  Labs (all labs ordered are listed, but only abnormal results are displayed) Labs Reviewed  BASIC METABOLIC PANEL - Abnormal; Notable for the following components:      Result Value   CO2 20 (*)    Creatinine, Ser 1.18 (*)    All other components within normal limits  CBC - Abnormal; Notable for the following components:   Hemoglobin 10.1 (*)    HCT 32.3 (*)    MCH 25.8 (*)    All other components within normal limits  SEDIMENTATION RATE - Abnormal; Notable for the following components:    Sed Rate 72 (*)    All other components within normal limits  C-REACTIVE PROTEIN - Abnormal; Notable for the following components:   CRP 10.4 (*)    All other components within normal limits  IRON AND TIBC - Abnormal; Notable for the following components:   Iron 10 (*)    TIBC 217 (*)    Saturation Ratios 5 (*)    All other components within normal limits  CULTURE, BLOOD (SINGLE)  TROPONIN I  FERRITIN  LACTATE DEHYDROGENASE  I-STAT TROPONIN, ED  I-STAT BETA HCG BLOOD, ED ([facility], [facility], [facility] ONLY)    EKG EKG Interpretation  Date/Time:  Friday October 26 2018 15:23:44 EST Ventricular Rate:  88  PR Interval:  136 QRS Duration: 80 QT Interval:  346 QTC Calculation: 418 R Axis:   60 Text Interpretation:  Normal sinus rhythm Normal ECG Nonspecific T wave abnormality RESOLVED SINCE PREVIOUS Confirmed by Blanchie Dessert [phone]) on 10/26/2018 3:31:23 PM   Radiology Dg Chest 2 View  Result Date: 10/26/2018 CLINICAL DATA:  Intermittent chest pain. EXAM: CHEST - 2 VIEW COMPARISON:  10/04/2018 FINDINGS: PICC line terminates at the cavoatrial junction. Cardiomediastinal silhouette is normal. Mediastinal contours appear intact. There is no evidence of focal airspace consolidation, pleural effusion or pneumothorax. Osseous structures are without acute abnormality. Soft tissues are grossly normal. IMPRESSION: No active cardiopulmonary disease. Electronically Signed   By: Fidela Salisbury M.D.   On: 10/26/2018 16:34    Procedures Procedures (including critical care time)  Medications Ordered in ED Medications  sodium chloride flush (NS) 0.9 % injection 3 mL (has no administration in time range)  sodium chloride 0.9 % bolus 1,000 mL (has no administration in time range)  0.9 %  sodium chloride infusion ( Intravenous Stopped 10/26/18 2005)     Initial Impression / Assessment and Plan / ED Course  I have reviewed the triage vital signs and the nursing notes.  Pertinent labs & imaging results  that were available during my care of the patient were reviewed by me and considered in my medical decision making (see chart for details).  Clinical Course as of Oct 26 2246  Fri Oct 26, 2018  1615 22 yo F with recently diagnosed endocarditis here with intermittent CP. Low-grade temps several days ago. Lungs CTAB here. Murmur is unchanged. Plan to check labs, including trop, d/w Dr. Doylene Canard. EKG non-ischemic.   [CI]  1638 Labs show mild dehydration. Dr. Doylene Canard paged   Enders note, inflammatory markers elevated.  I have sent a blood culture.  She has a normal white count, no fever, and I do not suspect this is infectious.  These were discussed with Dr. Doylene Canard again via telephone.  He recommends discharge with colchicine 0.6 daily for 2 weeks and he will see the patient this week.  Patient remains otherwise asymptomatic, well-appearing, and in no distress.  No signs of ACS.  Discharged with good return precautions.   [CI]  1956 Of note, HCG neg. Having difficulty crossing over per minilab.   [CI]    Clinical Course User Index [CI] Duffy Bruce, MD        Final Clinical Impressions(s) / ED Diagnoses   Final diagnoses:  Atypical chest pain  Elevated erythrocyte sedimentation rate  Anemia, unspecified type    ED Discharge Orders         Ordered    colchicine 0.6 MG tablet  Daily     10/26/18 Monico Blitz, MD 10/26/18 2248

## 2018-10-27 ENCOUNTER — Telehealth: Payer: Self-pay | Admitting: Infectious Diseases

## 2018-10-27 NOTE — Telephone Encounter (Signed)
Called by pt''s cardiologist with concern that pt was having ADR to vanco/ceftriaxone. Her ESR and CRP were elevated in ED and she was having CP.  I inquired about PE and DVT from [facility] which he believed were not present.   We agreed to hold anbx til pt seen in ID clinic on tuesday

## 2018-10-30 ENCOUNTER — Ambulatory Visit (INDEPENDENT_AMBULATORY_CARE_PROVIDER_SITE_OTHER): Payer: BLUE CROSS/BLUE SHIELD | Admitting: Infectious Disease

## 2018-10-30 ENCOUNTER — Encounter: Payer: Self-pay | Admitting: Infectious Disease

## 2018-10-30 ENCOUNTER — Telehealth: Payer: Self-pay

## 2018-10-30 DIAGNOSIS — I38 Endocarditis, valve unspecified: Secondary | ICD-10-CM

## 2018-10-30 DIAGNOSIS — I330 Acute and subacute infective endocarditis: Secondary | ICD-10-CM | POA: Diagnosis not present

## 2018-10-30 DIAGNOSIS — R0789 Other chest pain: Secondary | ICD-10-CM | POA: Diagnosis not present

## 2018-10-30 HISTORY — DX: Endocarditis, valve unspecified: I38

## 2018-10-30 HISTORY — DX: Other chest pain: R07.89

## 2018-10-30 NOTE — Progress Notes (Signed)
Subjective:   She is here for follow-up for culture-negative endocarditis  Patient ID: Pamela Maynard, female    DOB: [birth year], 23 y.o.   MRN: [id number]  HPI  23 y.o. 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  Then developed chest pain and was seen in the ER.  Chest x-ray was clear without evidence of pneumonia.  She was seen by Dr. Doylene Canard and an echocardiogram that showed no evidence of the vegetations anymore.  She does not appear to have been worked up for PE and I assume that this was considered low in the differential for the providers ER.  It was found that she was not eating much on antibiotics and they were discontinued and held until she could see [place] in clinic today.  She is now missed antibiotics since last Friday.  Her PICC line is still in place and is clean dry and intact and without evidence of thrombus about it.  Her chest pain has resolved.  We had lengthy discussion about how to proceed and ultimately decided to continue with resuming the vancomycin alone to complete an additional 2 weeks of therapy.  We will then check surveillance cultures 2+ weeks after that.  In the interim she has already been seen by her dentist who found no dental pathology that he needed to act upon or which she could find to blame for her 10 Shiley infectious endocarditis.    Past Medical History:  Diagnosis Date  . Endocarditis 10/30/2018    Past Surgical History:  Procedure Laterality Date  . TEE WITHOUT CARDIOVERSION N/A 10/05/2018   Procedure: TRANSESOPHAGEAL ECHOCARDIOGRAM (TEE);  Surgeon: Dixie Dials, MD;  Location: [facility] ENDOSCOPY;  Service: Cardiovascular;  Laterality: N/A;  . WISDOM TOOTH EXTRACTION      Family History  Problem  Relation Age of Onset  . Hypertension Mother   . Diabetes Mother       Social History   Socioeconomic History  . Marital status: Single    Spouse name: Not on file  . Number of children: Not on file  . Years of education: Not on file  . Highest education level: Not on file  Occupational History  . Not on file  Social Needs  . Financial resource strain: Not on file  . Food insecurity:    Worry: Not on file    Inability: Not on file  . Transportation needs:    Medical: Not on file    Non-medical: Not on file  Tobacco Use  . Smoking status: Never Smoker  . Smokeless tobacco: Never Used  Substance and Sexual Activity  . Alcohol use: Yes    Comment: occasinally  . Drug use: Yes    Types: Marijuana    Comment: 2-3 times a week  . Sexual activity: Not on file  Lifestyle  . Physical activity:    Days per week: Not on file    Minutes per session: Not on file  . Stress: Not on file  Relationships  . Social connections:    Talks on phone: Not on file    Gets together: Not on file    Attends religious service: Not on file    Active member of club or organization: Not on file    Attends meetings of clubs or organizations:  Not on file    Relationship status: Not on file  Other Topics Concern  . Not on file  Social History Narrative  . Not on file    No Known Allergies   Current Outpatient Medications:  .  cefTRIAXone (ROCEPHIN) IVPB, Inject 2 g into the vein daily. Indication:  Culture negative endocarditis Last Day of Therapy:  11/14/2018 Labs - Once weekly:  CBC/D and BMP, Labs - Every other week:  ESR and CRP, Disp: 37 Units, Rfl: 0 .  colchicine 0.6 MG tablet, Take 1 tablet (0.6 mg total) by mouth daily for 14 days., Disp: 14 tablet, Rfl: 0 .  hydrOXYzine (ATARAX/VISTARIL) 25 MG tablet, Take 25 mg by mouth every 6 (six) hours as needed for itching., Disp: , Rfl:  .  TARINA FE 1/20 EQ 1-20 MG-MCG tablet, Take 1 tablet by mouth daily., Disp: , Rfl:  .  vancomycin IVPB,  Inject 1,000 mg into the vein every 12 (twelve) hours. 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 Labs - Thursday:  BMP and vancomycin trough Labs - Every other week:  ESR and CRP, Disp: 74 Units, Rfl: 0  Review of Systems  Constitutional: Positive for appetite change. Negative for chills and fever.  HENT: Negative for congestion and sore throat.   Eyes: Negative for photophobia.  Respiratory: Negative for cough, shortness of breath and wheezing.   Cardiovascular: Negative for chest pain, palpitations and leg swelling.  Gastrointestinal: Negative for abdominal pain, blood in stool, constipation, diarrhea, nausea and vomiting.  Genitourinary: Negative for dysuria, flank pain and hematuria.  Musculoskeletal: Negative for back pain and myalgias.  Skin: Negative for rash.  Neurological: Negative for dizziness, weakness and headaches.  Hematological: Does not bruise/bleed easily.  Psychiatric/Behavioral: Negative for agitation, behavioral problems, hallucinations and suicidal ideas.       Objective:   Physical Exam Constitutional:      General: She is not in acute distress.    Appearance: She is not diaphoretic.  HENT:     Head: Normocephalic and atraumatic.     Right Ear: External ear normal.     Left Ear: External ear normal.     Nose: Nose normal.     Mouth/Throat:     Pharynx: No oropharyngeal exudate.  Eyes:     General: No scleral icterus.    Conjunctiva/sclera: Conjunctivae normal.     Pupils: Pupils are equal, round, and reactive to light.  Neck:     Musculoskeletal: Normal range of motion and neck supple.  Cardiovascular:     Rate and Rhythm: Normal rate and regular rhythm.     Heart sounds: Normal heart sounds. No murmur. No friction rub. No gallop.   Pulmonary:     Effort: Pulmonary effort is normal. No respiratory distress.     Breath sounds: Normal breath sounds. No wheezing or  rales.  Abdominal:     General: Bowel sounds are normal. There is no distension.     Palpations: Abdomen is soft.     Tenderness: There is no abdominal tenderness. There is no rebound.  Musculoskeletal: Normal range of motion.        General: No tenderness.  Lymphadenopathy:     Cervical: No cervical adenopathy.  Skin:    General: Skin is warm and dry.     Coloration: Skin is not pale.     Findings: No erythema, lesion or rash.  Neurological:  General: No focal deficit present.     Mental Status: She is alert and oriented to person, place, and time.     Coordination: Coordination normal.  Psychiatric:        Mood and Affect: Mood normal.        Behavior: Behavior normal.        Thought Content: Thought content normal.        Judgment: Judgment normal.     PIC is clean and dry and intact      Assessment & Plan:  Culture-negative endocarditis: We will restart vancomycin however complete additional 2 weeks.  Home care can pull her PICC line at the completion of therapy.  Labs should be done as per previous orders.  2+ weeks after completing antibiotics will check surveillance cultures  We cannot exclude that this was a noninfectious endocarditis but she does not have much in the way to suggest antiphospholipid syndrome or some other thrombotic condition.  Atypical chest pain: I have told the patient and her father to have a very low threshold to be seen in the ER should this pain recur as pulmonary embolism was never worked up.  I spent greater than 25 minutes with the patient including greater than 50% of time in face to face counsel of the patient father regarding the nature of infectious endocarditis noninfectious endocarditis its work-up and management and in coordination of her care.

## 2018-10-30 NOTE — Telephone Encounter (Signed)
Per Dr. Daiva Eves called [facility] to restart patient IV vancomycin for two weeks. Home Health will need to continue with original lab orders and fax to our office to review.  Spoke with Amy at [facility] who was able to take verbal order to continue IV vancomycin for 2 weeks. Amy was also informed that patient antibiotics were stopped by ED on 2/21. Informed pharmacy that patient has 14 doses on Vancomycin 1500 mg on hand that expired on 2/29. Home Health will call patient to schedule appointment to restart IV therapy, Lorenso Courier, CMA

## 2018-10-31 LAB — CULTURE, BLOOD (SINGLE)
Culture: NO GROWTH
Special Requests: ADEQUATE

## 2018-11-20 ENCOUNTER — Ambulatory Visit (INDEPENDENT_AMBULATORY_CARE_PROVIDER_SITE_OTHER): Payer: BLUE CROSS/BLUE SHIELD | Admitting: Infectious Disease

## 2018-11-20 ENCOUNTER — Other Ambulatory Visit: Payer: Self-pay

## 2018-11-20 VITALS — Ht 63.0 in | Wt 182.0 lb

## 2018-11-20 DIAGNOSIS — I339 Acute and subacute endocarditis, unspecified: Secondary | ICD-10-CM

## 2018-11-20 DIAGNOSIS — R0789 Other chest pain: Secondary | ICD-10-CM

## 2018-11-20 DIAGNOSIS — Z0389 Encounter for observation for other suspected diseases and conditions ruled out: Secondary | ICD-10-CM | POA: Diagnosis not present

## 2018-11-20 NOTE — Progress Notes (Signed)
Subjective:   She is here for follow-up for culture-negative endocarditis  Patient ID: Pamela Maynard, female    DOB: 09-Jul-1996, 22 y.o.   MRN: [id number]  HPI  23 y.o. 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  Then developed chest pain and was seen in the ER.  Chest x-ray was clear without evidence of pneumonia.  She was seen by Dr. Algie Coffer and an echocardiogram that showed no evidence of the vegetations anymore.  She does not appear to have been worked up for PE and I assume that this was considered low in the differential for the providers ER.  It was found that she was not eating much on antibiotics and they were discontinued and held until she could see [place] in clinic today.  She is now missed antibiotics since last Friday.  Her PICC line is still in place and is clean dry and intact and without evidence of thrombus about it.  Her chest pain has resolved.  We had lengthy discussion about how to proceed and ultimately decided to continue with resuming the vancomycin alone to complete an additional 2 weeks of therapy.  We will then check surveillance cultures 2+ weeks after that.  In the interim she has already been seen by her dentist who found no dental pathology that he needed to act upon or which she could find to blame for her subacute culture negative endocarditis.   Complete her antibiotics is doing well.  She has no complaints.   Past Medical History:  Diagnosis Date  . Chest pain, atypical 10/30/2018  . Endocarditis 10/30/2018  . Endocarditis     Past Surgical History:  Procedure Laterality Date  . TEE WITHOUT CARDIOVERSION N/A 10/05/2018   Procedure: TRANSESOPHAGEAL ECHOCARDIOGRAM (TEE);  Surgeon: Orpah Cobb, MD;   Location: [facility] ENDOSCOPY;  Service: Cardiovascular;  Laterality: N/A;  . WISDOM TOOTH EXTRACTION      Family History  Problem Relation Age of Onset  . Hypertension Mother   . Diabetes Mother       Social History   Socioeconomic History  . Marital status: Single    Spouse name: Not on file  . Number of children: Not on file  . Years of education: Not on file  . Highest education level: Not on file  Occupational History  . Not on file  Social Needs  . Financial resource strain: Not on file  . Food insecurity:    Worry: Not on file    Inability: Not on file  . Transportation needs:    Medical: Not on file    Non-medical: Not on file  Tobacco Use  . Smoking status: Never Smoker  . Smokeless tobacco: Never Used  Substance and Sexual Activity  . Alcohol use: Yes    Comment: occasinally  . Drug use: Yes    Types: Marijuana    Comment: 2-3 times a week  . Sexual activity: Not on file  Lifestyle  . Physical activity:    Days per week: Not on file    Minutes per session: Not on file  . Stress: Not on file  Relationships  . Social connections:    Talks on phone: Not on file    Gets together: Not on file    Attends religious service: Not on  file    Active member of club or organization: Not on file    Attends meetings of clubs or organizations: Not on file    Relationship status: Not on file  Other Topics Concern  . Not on file  Social History Narrative  . Not on file    No Known Allergies   Current Outpatient Medications:  .  colchicine 0.6 MG tablet, Take 1 tablet (0.6 mg total) by mouth daily for 14 days. (Patient not taking: Reported on 10/30/2018), Disp: 14 tablet, Rfl: 0 .  hydrOXYzine (ATARAX/VISTARIL) 25 MG tablet, Take 25 mg by mouth every 6 (six) hours as needed for itching., Disp: , Rfl:  .  TARINA FE 1/20 EQ 1-20 MG-MCG tablet, Take 1 tablet by mouth daily., Disp: , Rfl:   Review of Systems  Constitutional: Negative for appetite change, chills and  fever.  HENT: Negative for congestion and sore throat.   Eyes: Negative for photophobia.  Respiratory: Negative for cough, shortness of breath and wheezing.   Cardiovascular: Negative for chest pain, palpitations and leg swelling.  Gastrointestinal: Negative for abdominal pain, blood in stool, constipation, diarrhea, nausea and vomiting.  Genitourinary: Negative for dysuria, flank pain and hematuria.  Musculoskeletal: Negative for back pain and myalgias.  Skin: Negative for rash.  Neurological: Negative for dizziness, weakness and headaches.  Hematological: Does not bruise/bleed easily.  Psychiatric/Behavioral: Negative for agitation, behavioral problems, hallucinations and suicidal ideas.       Objective:   Physical Exam Constitutional:      General: She is not in acute distress.    Appearance: She is not diaphoretic.  HENT:     Head: Normocephalic and atraumatic.     Right Ear: External ear normal.     Left Ear: External ear normal.     Nose: Nose normal.     Mouth/Throat:     Pharynx: No oropharyngeal exudate.  Eyes:     General: No scleral icterus.    Conjunctiva/sclera: Conjunctivae normal.     Pupils: Pupils are equal, round, and reactive to light.  Neck:     Musculoskeletal: Normal range of motion and neck supple.  Cardiovascular:     Rate and Rhythm: Normal rate and regular rhythm.     Heart sounds: Murmur present. Systolic murmur present with a grade of 2/6. No friction rub. No gallop.   Pulmonary:     Effort: Pulmonary effort is normal. No respiratory distress.     Breath sounds: Normal breath sounds. No wheezing or rales.  Abdominal:     General: Bowel sounds are normal. There is no distension.     Palpations: Abdomen is soft.     Tenderness: There is no abdominal tenderness. There is no rebound.  Musculoskeletal: Normal range of motion.        General: No tenderness.  Lymphadenopathy:     Cervical: No cervical adenopathy.  Skin:    General: Skin is warm and  dry.     Coloration: Skin is not pale.     Findings: No erythema, lesion or rash.  Neurological:     General: No focal deficit present.     Mental Status: She is alert and oriented to person, place, and time.     Coordination: Coordination normal.  Psychiatric:        Mood and Affect: Mood normal.        Behavior: Behavior normal.        Thought Content: Thought content normal.  Judgment: Judgment normal.     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  Be prudent to repeat an echocardiogram in the future.  We will let her follow-up with her primary care physician and cardiology for this.  Atypical chest pain: Has resolved.

## 2018-11-27 LAB — CULTURE BLOOD MANUAL
Micro Number: 332521
Micro Number: 332530
RESULT 123: NO GROWTH
Result: NO GROWTH
Specimen Quality: ADEQUATE
Specimen Quality: ADEQUATE

## 2020-05-27 IMAGING — DX DG CHEST 1V PORT
1 series · 1 of 1 positions shown · non-contrast
Comparison: None.

CLINICAL DATA: Chest pain for 2 days with shortness of breath

EXAM:
PORTABLE CHEST 1 VIEW

[chest ap]
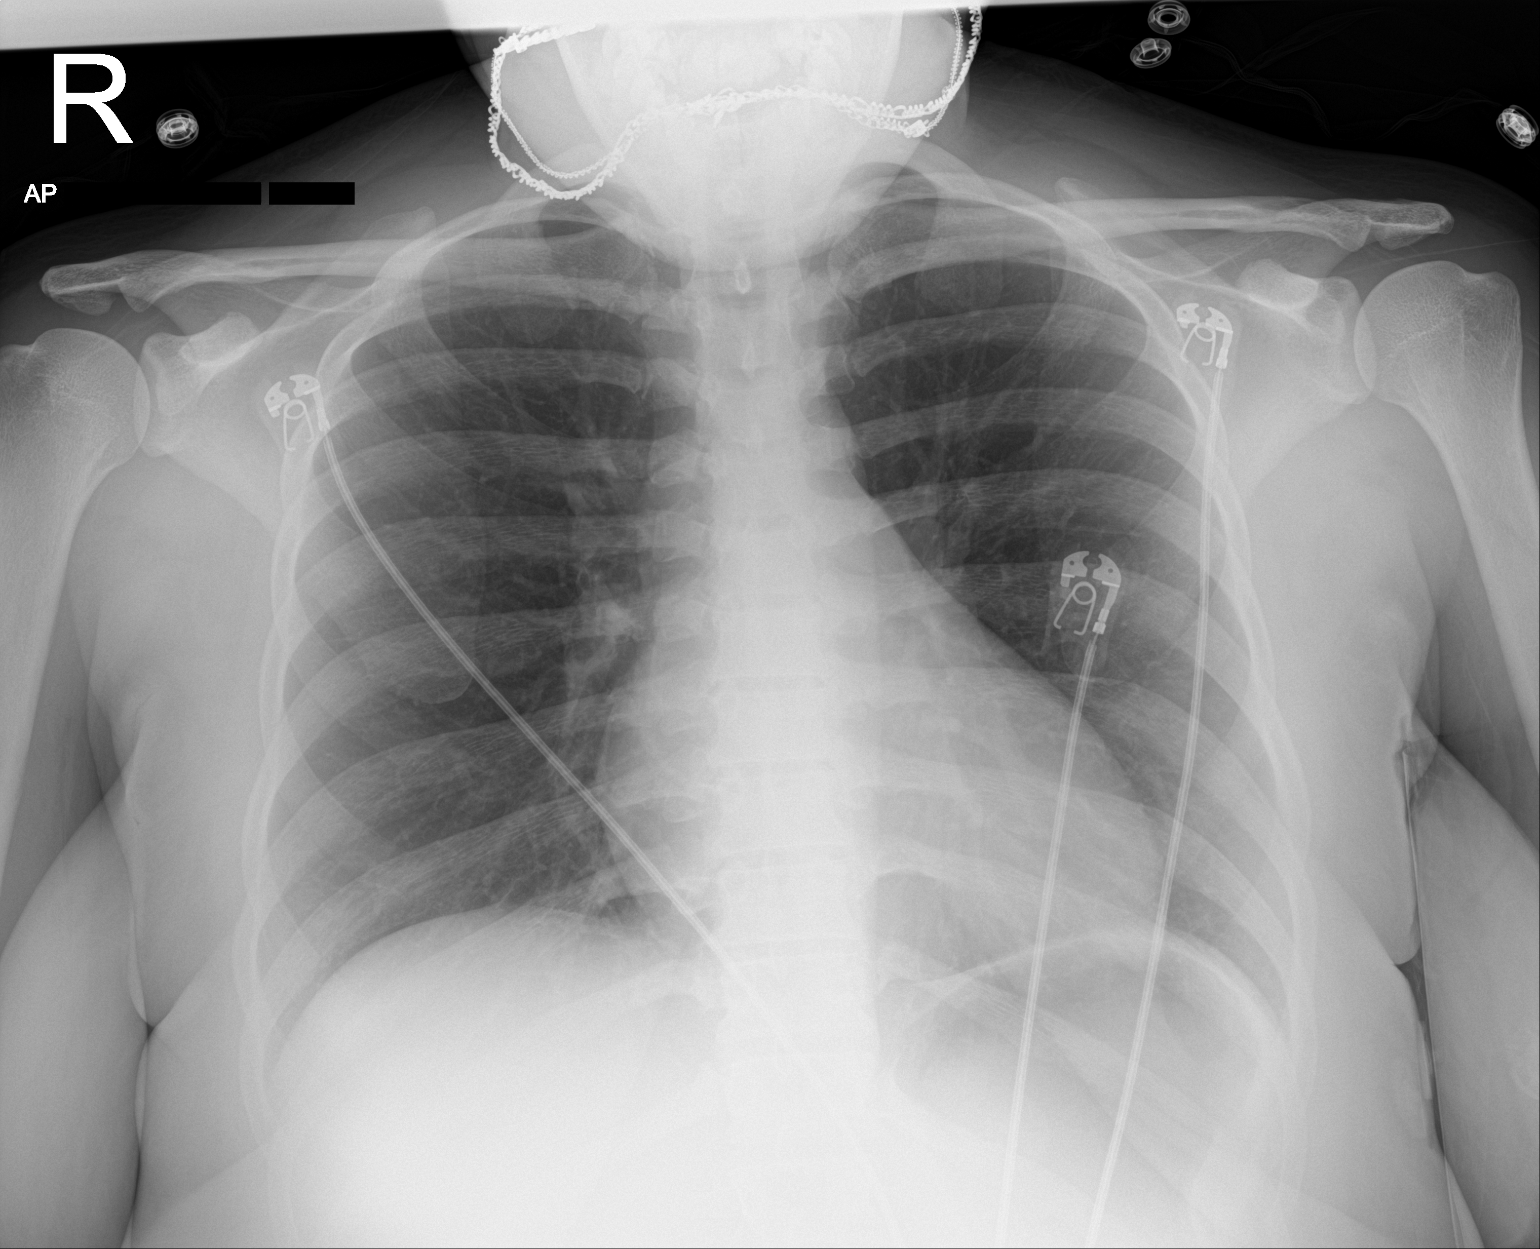

[1 of 1 positions shown; findings below may reference images not displayed]

FINDINGS: The heart size and mediastinal contours are within normal limits.
Both lungs are clear. The visualized skeletal structures are
unremarkable.
IMPRESSION: No active disease.

## 2020-06-18 IMAGING — CR DG CHEST 2V
2 series · 2 of 2 positions shown · non-contrast
Comparison: 10/04/2018

CLINICAL DATA: Intermittent chest pain.

EXAM:
CHEST - 2 VIEW

[chest pa]
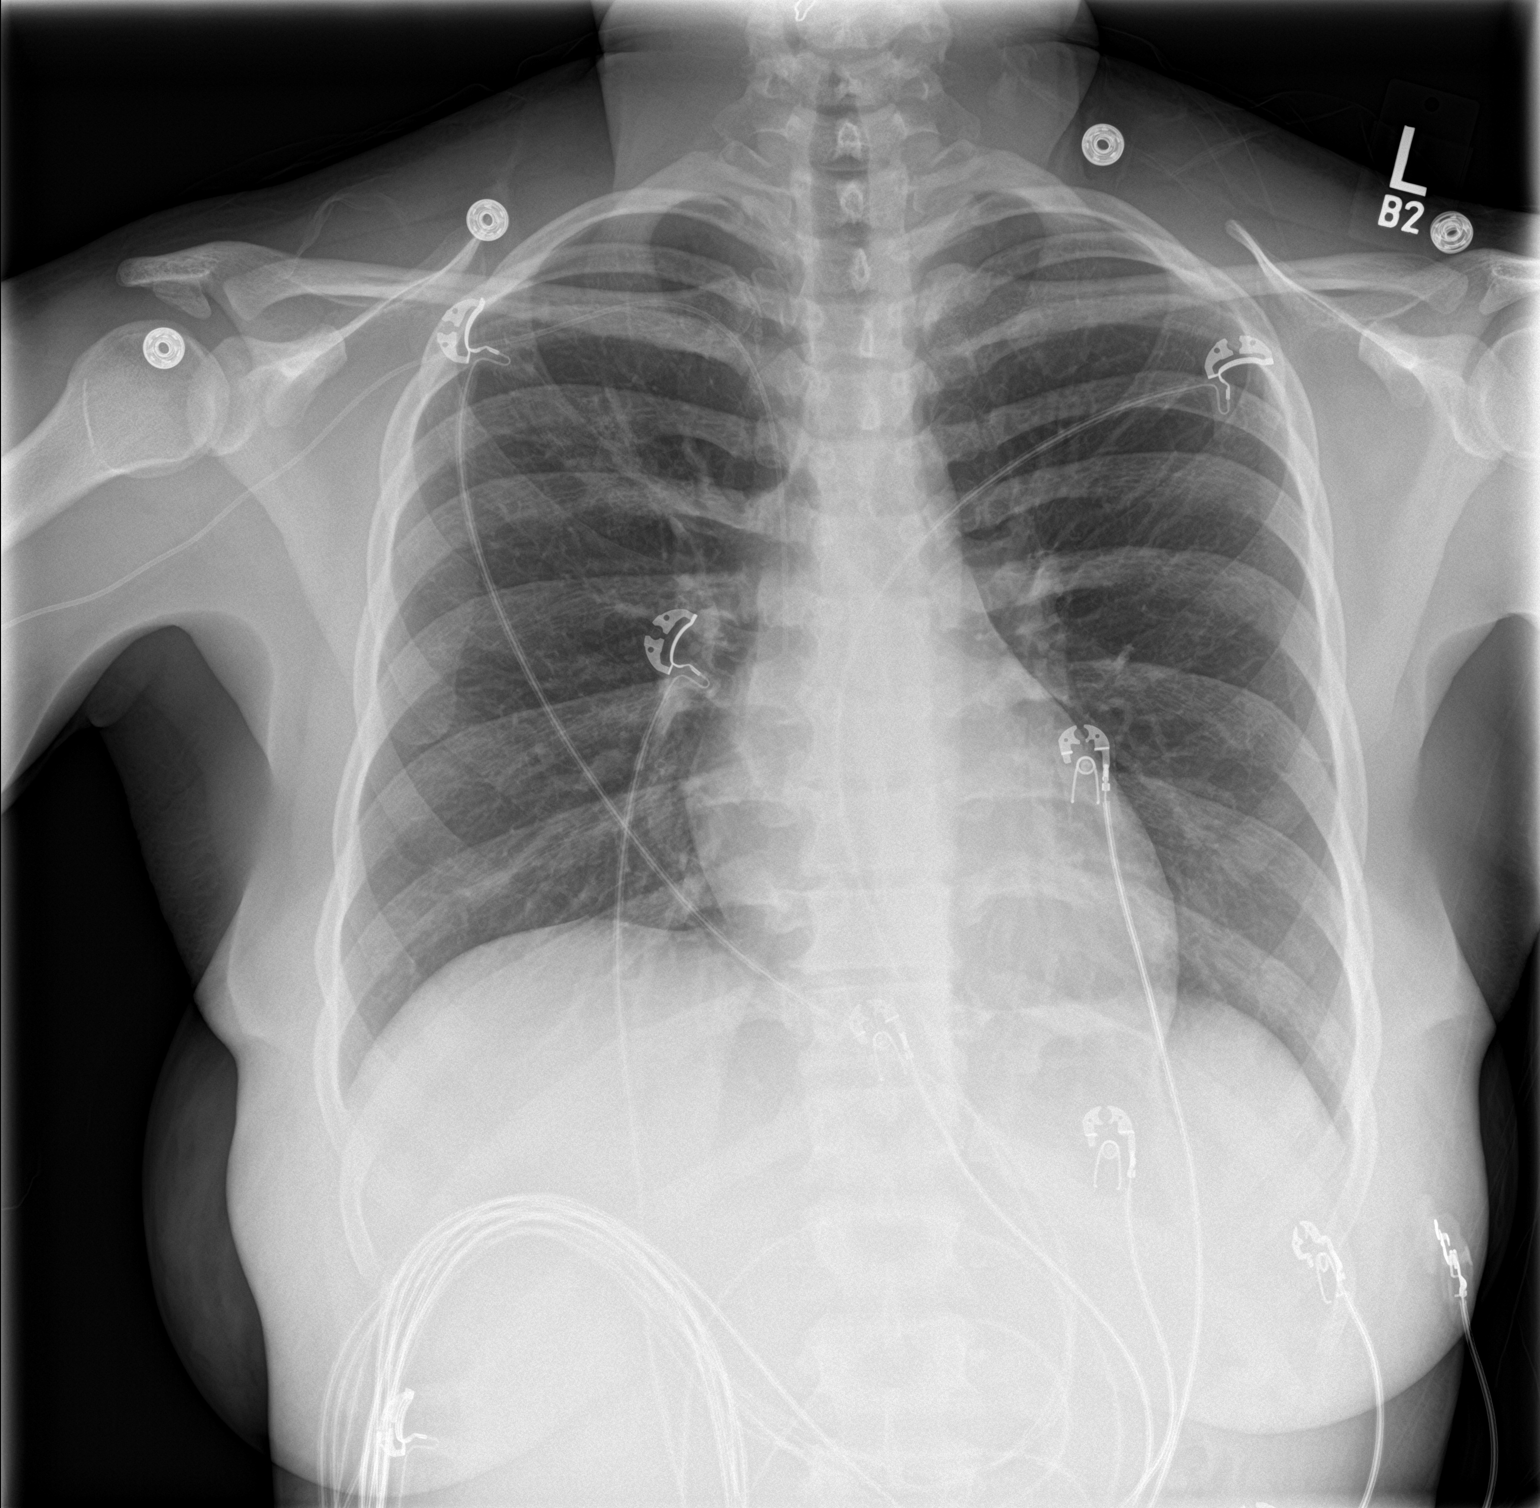

[chest lat]
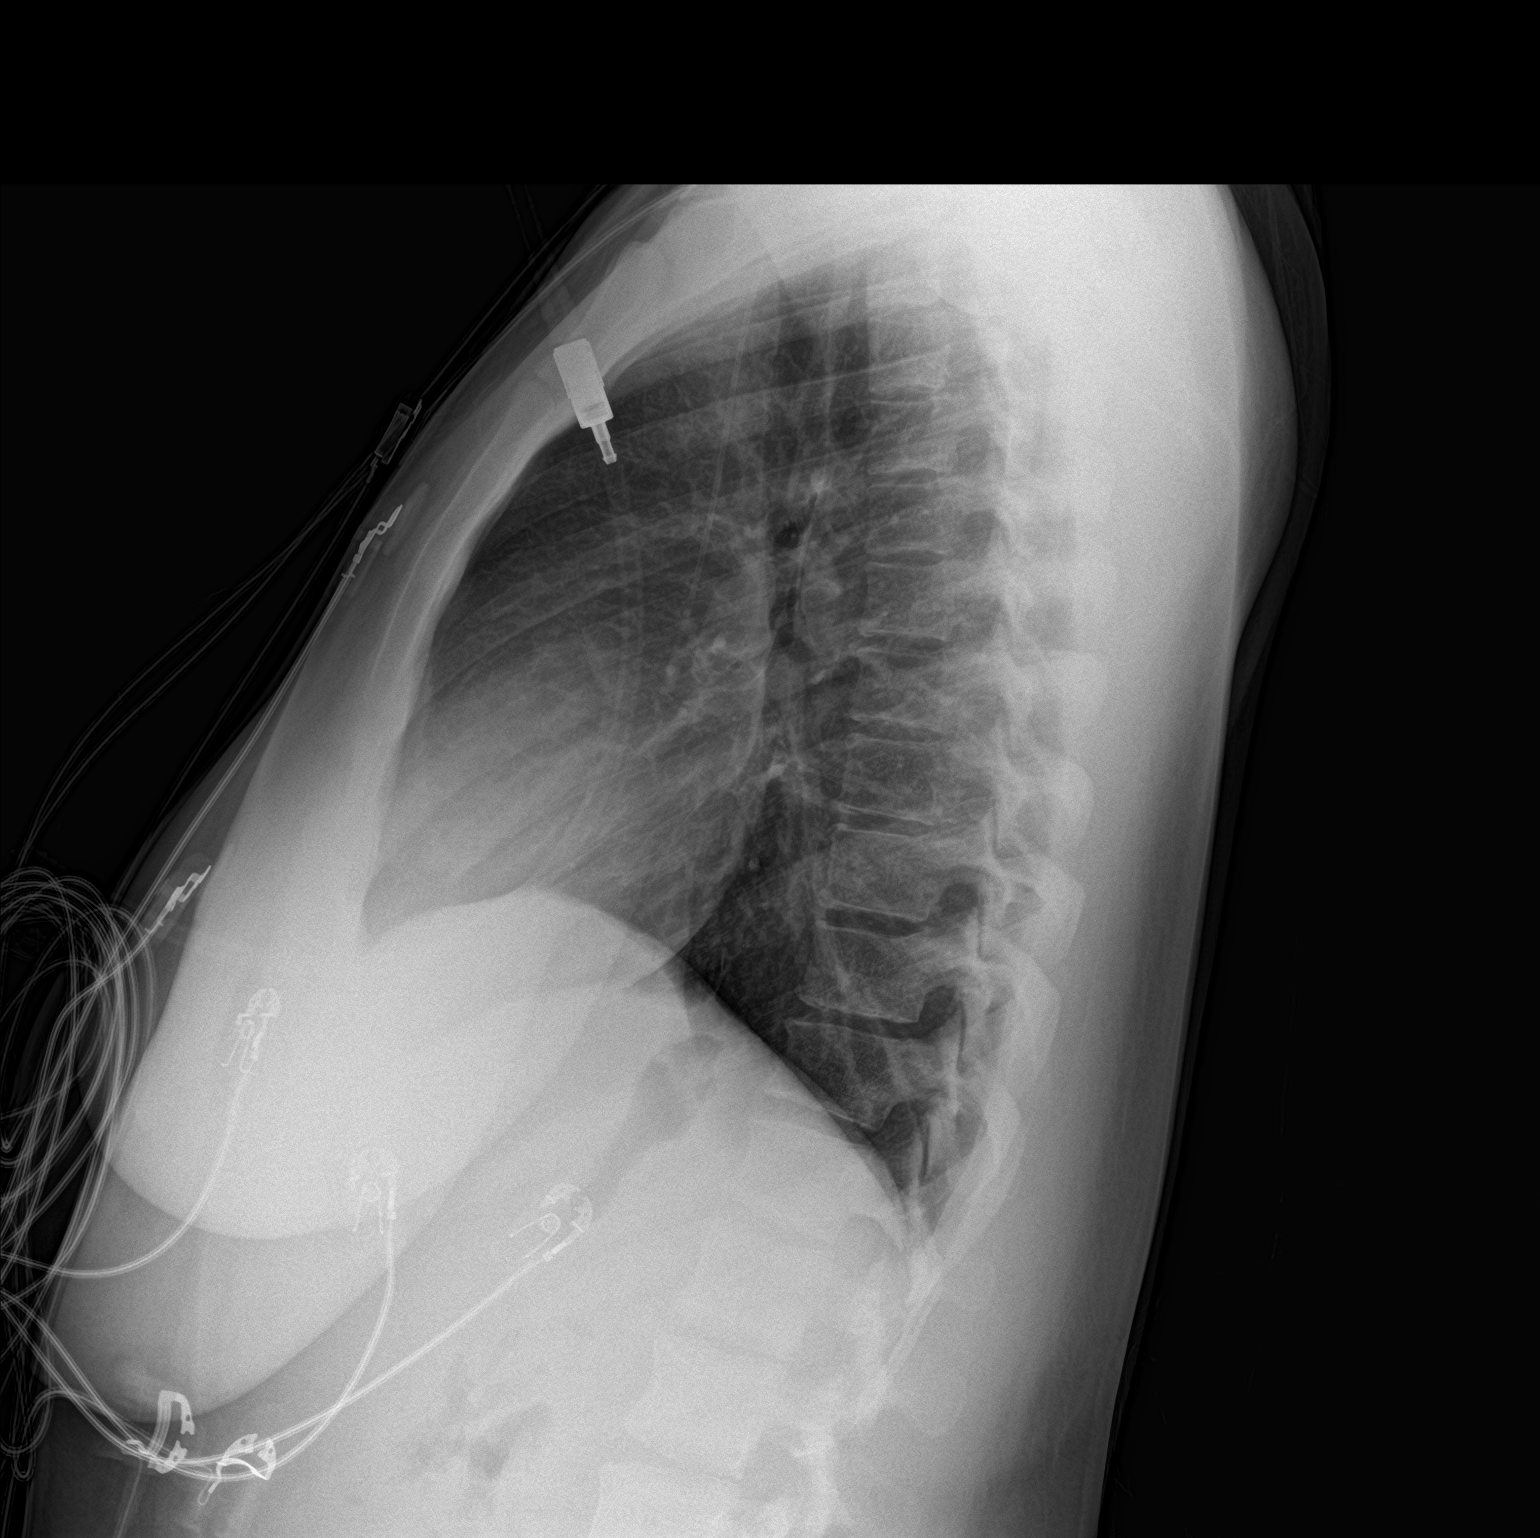

[2 of 2 positions shown; findings below may reference images not displayed]

FINDINGS: PICC line terminates at the cavoatrial junction.

Cardiomediastinal silhouette is normal. Mediastinal contours appear
intact.

There is no evidence of focal airspace consolidation, pleural
effusion or pneumothorax.

Osseous structures are without acute abnormality. Soft tissues are
grossly normal.
IMPRESSION: No active cardiopulmonary disease.

## 2021-07-06 ENCOUNTER — Encounter (HOSPITAL_COMMUNITY): Payer: Self-pay

## 2021-07-06 ENCOUNTER — Other Ambulatory Visit: Payer: Self-pay

## 2021-07-06 ENCOUNTER — Ambulatory Visit (HOSPITAL_COMMUNITY)
Admission: RE | Admit: 2021-07-06 | Discharge: 2021-07-06 | Disposition: A | Discharge status: Home / Self Care | Payer: BC Managed Care – PPO | Source: Ambulatory Visit

## 2021-07-06 VITALS — BP 131/82 | HR 70 | Temp 99.0°F | Resp 20

## 2021-07-06 DIAGNOSIS — J111 Influenza due to unidentified influenza virus with other respiratory manifestations: Secondary | ICD-10-CM

## 2021-07-06 NOTE — ED Provider Notes (Signed)
[place]    CSN: [id number] Arrival date & time: 07/06/21  1900      History   Chief Complaint Chief Complaint  Patient presents with   Cough   Sore Throat    HPI Pamela Maynard is a 25 y.o. female presenting with cough, sore throat, fatigue, x4 days following exposure to the flu.  Medical history noncontributory.  Describes 4 days of symptoms, they are controlled on over-the-counter medications.  Decreased appetite but she is tolerating fluids.  Has not monitored temperature at home.  Denies shortness of breath, chest pain, dizziness, weakness.  HPI  Past Medical History:  Diagnosis Date   Chest pain, atypical 10/30/2018   Endocarditis 10/30/2018   Endocarditis     Patient Active Problem List   Diagnosis Date Noted   Endocarditis 10/30/2018   Chest pain, atypical 10/30/2018   Observation for suspected infectious endocarditis of mitral valve 10/04/2018    Past Surgical History:  Procedure Laterality Date   TEE WITHOUT CARDIOVERSION N/A 10/05/2018   Procedure: TRANSESOPHAGEAL ECHOCARDIOGRAM (TEE);  Surgeon: Dixie Dials, MD;  Location: [facility] ENDOSCOPY;  Service: Cardiovascular;  Laterality: N/A;   WISDOM TOOTH EXTRACTION      OB History   No obstetric history on file.      Home Medications    Prior to Admission medications   Medication Sig Start Date End Date Taking? Authorizing Provider  colchicine 0.6 MG tablet Take 1 tablet (0.6 mg total) by mouth daily for 14 days. Patient not taking: Reported on 10/30/2018 10/26/18 11/09/18  Duffy Bruce, MD  hydrOXYzine (ATARAX/VISTARIL) 25 MG tablet Take 25 mg by mouth every 6 (six) hours as needed for itching.    [provider]  TARINA FE 1/20 EQ 1-20 MG-MCG tablet Take 1 tablet by mouth daily. 09/10/18   [provider]    Family History Family History  Problem Relation Age of Onset   Hypertension Mother    Diabetes Mother     Social History Social History   Tobacco Use   Smoking  status: Never   Smokeless tobacco: Never  Vaping Use   Vaping Use: Never used  Substance Use Topics   Alcohol use: Yes    Comment: occasinally   Drug use: Yes    Types: Marijuana    Comment: 2-3 times a week     Allergies   Patient has no known allergies.   Review of Systems Review of Systems  Constitutional:  Positive for appetite change and chills. Negative for fever.  HENT:  Positive for congestion. Negative for ear pain, rhinorrhea, sinus pressure, sinus pain and sore throat.   Eyes:  Negative for redness and visual disturbance.  Respiratory:  Positive for cough. Negative for chest tightness, shortness of breath and wheezing.   Cardiovascular:  Negative for chest pain and palpitations.  Gastrointestinal:  Negative for abdominal pain, constipation, diarrhea, nausea and vomiting.  Genitourinary:  Negative for dysuria, frequency and urgency.  Musculoskeletal:  Negative for myalgias.  Neurological:  Negative for dizziness, weakness and headaches.  Psychiatric/Behavioral:  Negative for confusion.   All other systems reviewed and are negative.   Physical Exam Triage Vital Signs ED Triage Vitals  Enc Vitals Group     BP 07/06/21 2007 131/82     Pulse Rate 07/06/21 2007 70     Resp 07/06/21 2007 20     Temp 07/06/21 2009 99 F (37.2 C)     Temp Source 07/06/21 2009 Oral     SpO2  07/06/21 2007 98 %     Weight --      Height --      Head Circumference --      Peak Flow --      Pain Score 07/06/21 2007 5     Pain Loc --      Pain Edu? --      Excl. in GC? --    No data found.  Updated Vital Signs BP 131/82 (BP Location: Left Arm)   Pulse 70   Temp 99 F (37.2 C) (Oral)   Resp 20   LMP 06/15/2021 (Approximate)   SpO2 98%   Visual Acuity Right Eye Distance:   Left Eye Distance:   Bilateral Distance:    Right Eye Near:   Left Eye Near:    Bilateral Near:     Physical Exam Vitals reviewed.  Constitutional:      General: She is not in acute distress.     Appearance: Normal appearance. She is not ill-appearing.  HENT:     Head: Normocephalic and atraumatic.     Right Ear: Tympanic membrane, ear canal and external ear normal. No tenderness. No middle ear effusion. There is no impacted cerumen. Tympanic membrane is not perforated, erythematous, retracted or bulging.     Left Ear: Tympanic membrane, ear canal and external ear normal. No tenderness.  No middle ear effusion. There is no impacted cerumen. Tympanic membrane is not perforated, erythematous, retracted or bulging.     Nose: Nose normal. No congestion.     Mouth/Throat:     Mouth: Mucous membranes are moist.     Pharynx: Uvula midline. No oropharyngeal exudate or posterior oropharyngeal erythema.  Eyes:     Extraocular Movements: Extraocular movements intact.     Pupils: Pupils are equal, round, and reactive to light.  Cardiovascular:     Rate and Rhythm: Normal rate and regular rhythm.     Heart sounds: Normal heart sounds.  Pulmonary:     Effort: Pulmonary effort is normal.     Breath sounds: Normal breath sounds. No decreased breath sounds, wheezing, rhonchi or rales.  Abdominal:     Palpations: Abdomen is soft.     Tenderness: There is no abdominal tenderness. There is no guarding or rebound.  Lymphadenopathy:     Cervical: No cervical adenopathy.     Right cervical: No superficial cervical adenopathy.    Left cervical: No superficial cervical adenopathy.  Neurological:     General: No focal deficit present.     Mental Status: She is alert and oriented to person, place, and time.  Psychiatric:        Mood and Affect: Mood normal.        Behavior: Behavior normal.        Thought Content: Thought content normal.        Judgment: Judgment normal.     UC Treatments / Results  Labs (all labs ordered are listed, but only abnormal results are displayed) Labs Reviewed - No data to display  EKG   Radiology No results found.  Procedures Procedures (including critical  care time)  Medications Ordered in UC Medications - No data to display  Initial Impression / Assessment and Plan / UC Course  I have reviewed the triage vital signs and the nursing notes.  Pertinent labs & imaging results that were available during my care of the patient were reviewed by me and considered in my medical decision making (see chart for details).  This patient is a very pleasant 25 y.o. year old female presenting with influenza following exposure to influenza. Today this pt is afebrile nontachycardic nontachypneic, oxygenating well on room air, no wheezes rhonchi or rales.   Rapid influenza deferred given duration of symptoms.  She is out of the Tamiflu window  She is content with over-the-counter medications for management of her symptoms  ED return precautions discussed. Patient verbalizes understanding and agreement.  .   Final Clinical Impressions(s) / UC Diagnoses   Final diagnoses:  Influenza with respiratory manifestation     Discharge Instructions      -You have the flu -For fevers/chills, bodyaches, headaches- You can take Tylenol up to 1000 mg 3 times daily, and ibuprofen up to 600 mg 3 times daily with food.  You can take these together, or alternate every 3-4 hours. -Drink plenty of water/gatorade and get plenty of rest -With a virus, you're typically contagious for 5-7 days, or as long as you're having fevers.  -Come back and see [place] if things are getting worse instead of better, like shortness of breath, chest pain, fevers and chills that are getting higher instead of lower and do not come down with Tylenol or ibuprofen, etc.      ED Prescriptions   None    PDMP not reviewed this encounter.   Rhys Martini, PA-C 07/06/21 2033

## 2021-07-06 NOTE — ED Triage Notes (Signed)
Pt presents with cough, sore throat and fatigue x 4 days.

## 2021-07-06 NOTE — Discharge Instructions (Addendum)
-  You have the flu -For fevers/chills, bodyaches, headaches- You can take Tylenol up to 1000 mg 3 times daily, and ibuprofen up to 600 mg 3 times daily with food.  You can take these together, or alternate every 3-4 hours. -Drink plenty of water/gatorade and get plenty of rest -With a virus, you're typically contagious for 5-7 days, or as long as you're having fevers.  -Come back and see [place] if things are getting worse instead of better, like shortness of breath, chest pain, fevers and chills that are getting higher instead of lower and do not come down with Tylenol or ibuprofen, etc.

## 2021-11-27 ENCOUNTER — Other Ambulatory Visit: Payer: Self-pay

## 2021-11-27 ENCOUNTER — Encounter (HOSPITAL_BASED_OUTPATIENT_CLINIC_OR_DEPARTMENT_OTHER): Payer: Self-pay

## 2021-11-27 DIAGNOSIS — R112 Nausea with vomiting, unspecified: Secondary | ICD-10-CM | POA: Insufficient documentation

## 2021-11-27 DIAGNOSIS — R0789 Other chest pain: Secondary | ICD-10-CM | POA: Diagnosis not present

## 2021-11-27 DIAGNOSIS — E876 Hypokalemia: Secondary | ICD-10-CM | POA: Diagnosis not present

## 2021-11-27 DIAGNOSIS — R079 Chest pain, unspecified: Secondary | ICD-10-CM | POA: Diagnosis present

## 2021-11-27 MED ORDER — ONDANSETRON 4 MG PO TBDP
4.0000 mg | ORAL_TABLET | Freq: Once | ORAL | Status: AC | PRN
  Administered 2021-11-27: 4 mg via ORAL
  Filled 2021-11-27: qty 1

## 2021-11-27 NOTE — ED Triage Notes (Signed)
Presented via triage with c/o n/v x3 days. Admits to having one episode of vomiting today. Denies fever  ? ?Pt stated that she started having generalized chest pressure that started around 2100 today. Denies sob  ?

## 2021-11-28 ENCOUNTER — Emergency Department (HOSPITAL_BASED_OUTPATIENT_CLINIC_OR_DEPARTMENT_OTHER)
Admission: EM | Admit: 2021-11-28 | Discharge: 2021-11-28 | Disposition: A | Discharge status: Home / Self Care | Payer: BC Managed Care – PPO | Attending: Emergency Medicine | Admitting: Emergency Medicine

## 2021-11-28 DIAGNOSIS — R0789 Other chest pain: Secondary | ICD-10-CM

## 2021-11-28 DIAGNOSIS — R112 Nausea with vomiting, unspecified: Secondary | ICD-10-CM

## 2021-11-28 LAB — URINALYSIS, ROUTINE W REFLEX MICROSCOPIC
Bilirubin Urine: NEGATIVE
Glucose, UA: NEGATIVE mg/dL
Ketones, ur: 40 mg/dL — AB
Leukocytes,Ua: NEGATIVE
Nitrite: NEGATIVE
Protein, ur: 30 mg/dL — AB
RBC / HPF: >50 RBC/hpf — ABNORMAL HIGH (ref 0–5)
Specific Gravity, Urine: 1.026 (ref 1.005–1.030)
pH: 7 (ref 5.0–8.0)

## 2021-11-28 LAB — COMPREHENSIVE METABOLIC PANEL
ALT: 11 U/L (ref 0–44)
AST: 13 U/L — ABNORMAL LOW (ref 15–41)
Albumin: 4.3 g/dL (ref 3.5–5.0)
Alkaline Phosphatase: 39 U/L (ref 38–126)
Anion gap: 10 (ref 5–15)
BUN: 9 mg/dL (ref 6–20)
CO2: 26 mmol/L (ref 22–32)
Calcium: 9.6 mg/dL (ref 8.9–10.3)
Chloride: 102 mmol/L (ref 98–111)
Creatinine, Ser: 0.73 mg/dL (ref 0.44–1.00)
GFR, Estimated: >60 mL/min (ref >60)
Glucose, Bld: 108 mg/dL — ABNORMAL HIGH (ref 70–99)
Potassium: 3.2 mmol/L — ABNORMAL LOW (ref 3.5–5.1)
Sodium: 138 mmol/L (ref 135–145)
Total Bilirubin: 0.6 mg/dL (ref 0.3–1.2)
Total Protein: 8.1 g/dL (ref 6.5–8.1)

## 2021-11-28 LAB — CBC
HCT: 41.4 % (ref 36.0–46.0)
Hemoglobin: 13.7 g/dL (ref 12.0–15.0)
MCH: 27.2 pg (ref 26.0–34.0)
MCHC: 33.1 g/dL (ref 30.0–36.0)
MCV: 82.3 fL (ref 80.0–100.0)
Platelets: 276 10*3/uL (ref 150–400)
RBC: 5.03 MIL/uL (ref 3.87–5.11)
RDW: 12.3 % (ref 11.5–15.5)
WBC: 8.3 10*3/uL (ref 4.0–10.5)
nRBC: 0 % (ref 0.0–0.2)

## 2021-11-28 LAB — PREGNANCY, URINE: Preg Test, Ur: NEGATIVE

## 2021-11-28 LAB — TROPONIN I (HIGH SENSITIVITY)
Troponin I (High Sensitivity): 5 ng/L (ref <18)
Troponin I (High Sensitivity): 5 ng/L (ref <18)

## 2021-11-28 LAB — LIPASE, BLOOD: Lipase: <10 U/L — ABNORMAL LOW (ref 11–51)

## 2021-11-28 MED ORDER — SODIUM CHLORIDE 0.9 % IV BOLUS
1000.0000 mL | Freq: Once | INTRAVENOUS | Status: AC
  Administered 2021-11-28: 1000 mL via INTRAVENOUS

## 2021-11-28 MED ORDER — LIDOCAINE VISCOUS HCL 2 % MT SOLN
15.0000 mL | Freq: Once | OROMUCOSAL | Status: AC
  Administered 2021-11-28: 15 mL via ORAL
  Filled 2021-11-28: qty 15

## 2021-11-28 MED ORDER — PROCHLORPERAZINE EDISYLATE 10 MG/2ML IJ SOLN
10.0000 mg | Freq: Once | INTRAMUSCULAR | Status: AC
Start: 2021-11-28 — End: 2021-11-28
  Administered 2021-11-28: 10 mg via INTRAVENOUS
  Filled 2021-11-28: qty 2

## 2021-11-28 MED ORDER — ALUM & MAG HYDROXIDE-SIMETH 200-200-20 MG/5ML PO SUSP
30.0000 mL | Freq: Once | ORAL | Status: AC
  Administered 2021-11-28: 30 mL via ORAL
  Filled 2021-11-28: qty 30

## 2021-11-28 MED ORDER — ONDANSETRON 4 MG PO TBDP: 4.0000 mg | ORAL_TABLET | Freq: Three times a day (TID) | ORAL | 0 refills | Status: AC | PRN

## 2021-11-28 MED ORDER — POTASSIUM CHLORIDE CRYS ER 20 MEQ PO TBCR
40.0000 meq | EXTENDED_RELEASE_TABLET | Freq: Once | ORAL | Status: AC
  Administered 2021-11-28: 40 meq via ORAL
  Filled 2021-11-28: qty 2

## 2021-11-28 MED ORDER — POTASSIUM CHLORIDE CRYS ER 20 MEQ PO TBCR: 20.0000 meq | EXTENDED_RELEASE_TABLET | Freq: Every day | ORAL | 0 refills | Status: DC

## 2021-11-28 NOTE — ED Provider Notes (Signed)
?[facility] ?Provider Note ? ?CSN: [id number] ?Arrival date & time: 11/27/21 2317 ? ?Chief Complaint(s) ?Chest Pain and Nausea ? ?HPI ?Pamela Maynard is a 26 y.o. 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  Patient reports that she is currently on her menstrual cycle and the nausea and vomiting are typical for her cycles however this is been more consistently normal.  Tonight she developed chest pressure around 9 PM prompting her visit.  Patient also reports feeling generally fatigued and dehydrated.  She has intermittent abdominal discomfort due to emesis and decreased oral intake.  She denies any fevers or chills.  No coughing or congestion.  No shortness of breath.  No urinary symptoms.  No diarrhea.  No suspicious food intake. ? ?She denies any recent alcohol use. ?Admits to daily marijuana use. ? ?The history is provided by the patient.  ? ?Past Medical History ?Past Medical History:  ?Diagnosis Date  ? Chest pain, atypical 10/30/2018  ? Endocarditis 10/30/2018  ? Endocarditis   ? ?Patient Active Problem List  ? Diagnosis Date Noted  ? Endocarditis 10/30/2018  ? Chest pain, atypical 10/30/2018  ? 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 ?Home Medication(s) ?Prior to Admission medications   ?Medication Sig Start Date End Date Taking? Authorizing Provider  ?ondansetron (ZOFRAN-ODT) 4 MG disintegrating tablet Take 1 tablet (4 mg total) by mouth every 8 (eight) hours as needed for up to 3 days for nausea or vomiting. 11/28/21 12/01/21 Yes Rayshun Kandler, Amadeo GarnetPedro Eduardo, MD  ?colchicine 0.6 MG tablet Take 1 tablet (0.6 mg total) by mouth daily for 14 days. ?Patient not taking: Reported on 10/30/2018 10/26/18 11/09/18  Shaune PollackIsaacs, [place], MD  ?hydrOXYzine (ATARAX/VISTARIL) 25 MG tablet Take 25 mg by mouth every 6 (six) hours as needed for  itching.    [provider]  ?TARINA FE 1/20 EQ 1-20 MG-MCG tablet Take 1 tablet by mouth daily. 09/10/18   [provider]  ?                                                                                                                                  ?Allergies ?Patient has no known allergies. ? ?Review of Systems ?Review of Systems ?As noted in HPI ? ?Physical Exam ?Vital Signs  ?I have reviewed the triage vital signs ?BP 124/88   Pulse (!) 52   Temp 98.8 ?F (37.1 ?C) (Oral)   Resp 12   Ht 5\' 3"  (1.6 m)   Wt 77.1 kg   SpO2 100%   BMI 30.11 kg/m?  ? ?Physical Exam ?Vitals reviewed.  ?Constitutional:   ?   General: She is not in acute distress. ?   Appearance: She is well-developed. She is not diaphoretic.  ?HENT:  ?   Head: Normocephalic and atraumatic.  ?  Nose: Nose normal.  ?Eyes:  ?   General: No scleral icterus.    ?   Right eye: No discharge.     ?   Left eye: No discharge.  ?   Conjunctiva/sclera: Conjunctivae normal.  ?   Pupils: Pupils are equal, round, and reactive to light.  ?Cardiovascular:  ?   Rate and Rhythm: Normal rate. Rhythm regularly irregular.  ?   Heart sounds: No murmur heard. ?  No friction rub. No gallop.  ?Pulmonary:  ?   Effort: Pulmonary effort is normal. No respiratory distress.  ?   Breath sounds: Normal breath sounds. No stridor. No rales.  ?Abdominal:  ?   General: There is no distension.  ?   Palpations: Abdomen is soft.  ?   Tenderness: There is no abdominal tenderness. There is no guarding or rebound.  ?Musculoskeletal:     ?   General: No tenderness.  ?   Cervical back: Normal range of motion and neck supple.  ?Skin: ?   General: Skin is warm and dry.  ?   Findings: No erythema or rash.  ?Neurological:  ?   Mental Status: She is alert and oriented to person, place, and time.  ? ? ?ED Results and Treatments ?Labs ?(all labs ordered are listed, but only abnormal results are displayed) ?Labs Reviewed  ?LIPASE, BLOOD - Abnormal; Notable for the following  components:  ?    Result Value  ? Lipase <10 (*)   ? All other components within normal limits  ?COMPREHENSIVE METABOLIC PANEL - Abnormal; Notable for the following components:  ? Potassium 3.2 (*)   ? Glucose, Bld 108 (*)   ? AST 13 (*)   ? All other components within normal limits  ?URINALYSIS, ROUTINE W REFLEX MICROSCOPIC - Abnormal; Notable for the following components:  ? APPearance HAZY (*)   ? Hgb urine dipstick LARGE (*)   ? Ketones, ur 40 (*)   ? Protein, ur 30 (*)   ? RBC / HPF >50 (*)   ? All other components within normal limits  ?CBC  ?PREGNANCY, URINE  ?TROPONIN I (HIGH SENSITIVITY)  ?TROPONIN I (HIGH SENSITIVITY)  ?                                                                                                                       ?EKG ? EKG Interpretation ? ?Date/Time:  Saturday November 27 2021 23:57:24 EDT ?Ventricular Rate:  52 ?PR Interval:  164 ?QRS Duration: 92 ?QT Interval:  402 ?QTC Calculation: 373 ?R Axis:   65 ?Text Interpretation: Sinus bradycardia ST & T wave abnormality, consider inferior ischemia ST & T wave abnormality, consider anterior ischemia Abnormal ECG When compared with ECG of 27-Nov-2021 23:49, Sinus rhythm has replaced Wide QRS rhythm Confirmed by Drema Pry [phone]) on 11/28/2021 3:25:04 AM ?  ? ?  ? ?Radiology ?No results found. ? ?Pertinent labs & imaging results that were available during my care of the patient were reviewed  by me and considered in my medical decision making (see MDM for details). ? ?Medications Ordered in ED ?Medications  ?ondansetron (ZOFRAN-ODT) disintegrating tablet 4 mg (4 mg Oral Given 11/27/21 2350)  ?sodium chloride 0.9 % bolus 1,000 mL (1,000 mLs Intravenous New Bag/Given 11/28/21 0405)  ?prochlorperazine (COMPAZINE) injection 10 mg (10 mg Intravenous Given 11/28/21 0407)  ?alum & mag hydroxide-simeth (MAALOX/MYLANTA) 200-200-20 MG/5ML suspension 30 mL (30 mLs Oral Given 11/28/21 0405)  ?  And  ?lidocaine (XYLOCAINE) 2 % viscous mouth solution 15 mL  (15 mLs Oral Given 11/28/21 0405)  ?                                                               ?                                                                    ?Procedures ?Procedures ? ?(including critical care time) ? ?Medical Decision Making / ED Course ? ? ? Complexity of Problem: ? ?Co-morbidities/SDOH that complicate the patient evaluation/care: ?Endocarditis, marijuana use. ? ?Additional history obtained: ?2020 admission for endocarditis.  Noted to have negative blood cultures.  Treated with IV antibiotics.  Followed by Dr. Algie Coffer. ? ?Patient's presenting problem/concern and DDX listed below: ?Chest pain. ?In the setting of 3 days of nausea/vomiting ?Possible viral process versus menstrual cycle versus cannabinoid hyperemesis.  Will assess for biliary disease, pancreatitis low suspicion for this or other serious intra-abdominal inflammatory/infectious processes. ?Likely GI related process/GERD. ?Low suspicion for cardiac etiology but will obtain EKG and tropes to rule out. ?We will also assess for pneumothorax, pneumomediastinum and pneumonia. ?Low suspicion for pulmonary embolism, or aortic dissection. ?We will also assess for pericarditis. ? ?Hospitalization Considered:  ?Yes ? ?Initial Intervention:  ?IVF, antiemetics, GI cocktail ? ? ?  Complexity of Data: ?  ?Cardiac Monitoring: ?The patient was maintained on a cardiac monitor.   ?I personally viewed and interpreted the cardiac monitored which showed an underlying rhythm of normal sinus rhythm with frequent PACs/PVCs.  No dysrhythmias or blocks. ? ?Laboratory Tests ordered listed below with my independent interpretation: ?CBC without leukocytosis or anemia ?Mild hypokalemia likely from GI losses.  No other significant electrolyte derangements or renal sufficiency.  No evidence of bili obstruction or pancreatitis. ?Serial troponins negative x2 ?  ?Imaging Studies ordered listed below with my independent interpretation: ?On my read of the chest  x-ray, there was no evidence suggestive of pneumonia, pneumothorax, pneumomediastinum, pulmonary edema concerning for new or exacerbation of heart failure, abnormal contour of the mediastinum to suggest dissection,

## 2021-11-30 ENCOUNTER — Other Ambulatory Visit (HOSPITAL_BASED_OUTPATIENT_CLINIC_OR_DEPARTMENT_OTHER): Payer: Self-pay

## 2022-03-13 DIAGNOSIS — F4323 Adjustment disorder with mixed anxiety and depressed mood: Secondary | ICD-10-CM | POA: Diagnosis not present

## 2022-03-25 ENCOUNTER — Encounter: Payer: Self-pay | Admitting: Emergency Medicine

## 2022-03-25 ENCOUNTER — Ambulatory Visit
Admission: EM | Admit: 2022-03-25 | Discharge: 2022-03-25 | Disposition: A | Discharge status: Home / Self Care | Payer: BC Managed Care – PPO | Attending: Physician Assistant | Admitting: Physician Assistant

## 2022-03-25 DIAGNOSIS — R112 Nausea with vomiting, unspecified: Secondary | ICD-10-CM | POA: Diagnosis not present

## 2022-03-25 MED ORDER — ONDANSETRON 4 MG PO TBDP
4.0000 mg | ORAL_TABLET | Freq: Once | ORAL | Status: AC
  Administered 2022-03-25: 4 mg via ORAL

## 2022-03-25 MED ORDER — ONDANSETRON 4 MG PO TBDP: 4.0000 mg | ORAL_TABLET | Freq: Three times a day (TID) | ORAL | 0 refills | Status: DC | PRN

## 2022-03-25 NOTE — ED Triage Notes (Signed)
Pt is present today with c/o nausea and vomiting. Pt states that when she is on her menstrual cycle she experiencing severe nausea and vomiting to the point where she becomes dehydrated

## 2022-03-25 NOTE — ED Provider Notes (Signed)
[name]    CSN: [id number] Arrival date & time: 03/25/22  8546      History   Chief Complaint Chief Complaint  Patient presents with   Emesis   Nausea    HPI Pamela Maynard is a 26 y.o. female.   Patient here today for evaluation of nausea and vomiting. She reports that symptoms started today but seem to occur when she is one her menstrual period. She reports in the past symptoms have gotten so severe she was evaluated in the ED where she was given zofran and IV fluids with some relief. She denies any abdominal pain. She is concerned she will get dehydrated which brought her into the clinic this morning. She denies fever. She does not report treatment for symptoms. She denies any chance of pregnancy- she reports she has not had sexual intercourse with a female in years.   The history is provided by the patient.  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      Home Medications    Prior to Admission medications   Medication Sig Start Date End Date Taking? Authorizing Provider  ondansetron (ZOFRAN-ODT) 4 MG disintegrating tablet Take 1 tablet (4 mg total) by mouth every 8 (eight) hours as needed. 03/25/22  Yes Tomi Bamberger, PA-C  colchicine 0.6 MG tablet Take 1 tablet (0.6 mg total) by mouth daily for  14 days. Patient not taking: Reported on 10/30/2018 10/26/18 11/09/18  Shaune Pollack, MD  hydrOXYzine (ATARAX/VISTARIL) 25 MG tablet Take 25 mg by mouth every 6 (six) hours as needed for itching.    [provider]  potassium chloride SA (KLOR-CON M) 20 MEQ tablet Take 1 tablet (20 mEq total) by mouth daily for 10 days. 11/28/21 12/08/21  Nira Conn, MD  TARINA FE 1/20 EQ 1-20 MG-MCG tablet Take 1 tablet by mouth daily. 09/10/18   [provider]    Family History Family History  Problem Relation Age of Onset   Hypertension Mother    Diabetes Mother     Social History Social History   Tobacco Use   Smoking status: Never   Smokeless tobacco: Never  Vaping Use   Vaping Use: Never used  Substance Use Topics   Alcohol use: Yes    Comment: occasinally   Drug use: Yes    Types: Marijuana    Comment: 2-3 times a week     Allergies   Patient has no known allergies.   Review of Systems Review of Systems  Constitutional:  Negative for chills and fever.  HENT:  Negative for congestion and rhinorrhea.   Eyes:  Negative for discharge and redness.  Respiratory:  Negative for shortness of breath.   Cardiovascular:  Negative for chest pain.  Gastrointestinal:  Positive for nausea and vomiting. Negative for abdominal  pain.  Genitourinary:  Positive for vaginal bleeding (currently mensturating). Negative for pelvic pain.     Physical Exam Triage Vital Signs ED Triage Vitals  Enc Vitals Group     BP 03/25/22 0929 130/81     Pulse Rate 03/25/22 0929 (!) 53     Resp 03/25/22 0929 18     Temp 03/25/22 0929 98.3 F (36.8 C)     Temp src --      SpO2 03/25/22 0929 99 %     Weight --      Height --      Head Circumference --      Peak Flow --      Pain Score 03/25/22 0932 0     Pain Loc --      Pain Edu? --      Excl. in GC? --    No data found.  Updated Vital Signs BP 130/81   Pulse (!) 53   Temp 98.3 F (36.8 C)   Resp 18   LMP 03/22/2022    SpO2 99%      Physical Exam Vitals and nursing note reviewed.  Constitutional:      General: She is not in acute distress.    Appearance: Normal appearance. She is not ill-appearing.  HENT:     Head: Normocephalic and atraumatic.     Nose: Nose normal.  Cardiovascular:     Rate and Rhythm: Normal rate and regular rhythm.     Heart sounds: Normal heart sounds. No murmur heard. Pulmonary:     Effort: Pulmonary effort is normal. No respiratory distress.     Breath sounds: Normal breath sounds. No wheezing, rhonchi or rales.  Abdominal:     General: Abdomen is flat. Bowel sounds are normal. There is no distension.     Palpations: Abdomen is soft.     Tenderness: There is no abdominal tenderness. There is no guarding.  Skin:    General: Skin is warm and dry.  Neurological:     Mental Status: She is alert.  Psychiatric:        Mood and Affect: Mood normal.        Thought Content: Thought content normal.      UC Treatments / Results  Labs (all labs ordered are listed, but only abnormal results are displayed) Labs Reviewed  CBC WITH DIFFERENTIAL/PLATELET  COMPREHENSIVE METABOLIC PANEL    EKG   Radiology No results found.  Procedures Procedures (including critical care time)  Medications Ordered in UC Medications  ondansetron (ZOFRAN-ODT) disintegrating tablet 4 mg (4 mg Oral Given 03/25/22 0949)    Initial Impression / Assessment and Plan / UC Course  I have reviewed the triage vital signs and the nursing notes.  Pertinent labs & imaging results that were available during my care of the patient were reviewed by me and considered in my medical decision making (see chart for details).   Routine labs ordered. Zofran administered in office and prescribed for outpatient use if needed. Recommended she drink plenty of fluids and follow up with any further concerns. Encouraged her to report to ED with any worsening. Patient expresses understanding.    Final Clinical  Impressions(s) / UC Diagnoses   Final diagnoses:  Nausea and vomiting, unspecified vomiting type     Discharge Instructions       Take Zofran as needed.   Drink plenty of fluids.   Follow up with any concerns or report to ED without any worsening.  ED Prescriptions     Medication Sig Dispense Auth. Provider   ondansetron (ZOFRAN-ODT) 4 MG disintegrating tablet Take 1 tablet (4 mg total) by mouth every 8 (eight) hours as needed. 20 tablet Tomi Bamberger, PA-C      PDMP not reviewed this encounter.   Tomi Bamberger, PA-C 03/25/22 1019

## 2022-03-25 NOTE — Discharge Instructions (Signed)
  Take Zofran as needed.   Drink plenty of fluids.   Follow up with any concerns or report to ED without any worsening.

## 2022-03-26 LAB — CBC WITH DIFFERENTIAL/PLATELET
Basophils Absolute: 0 10*3/uL (ref 0.0–0.2)
Basos: 0 %
EOS (ABSOLUTE): 0 10*3/uL (ref 0.0–0.4)
Eos: 0 %
Hematocrit: 42.9 % (ref 34.0–46.6)
Hemoglobin: 13.9 g/dL (ref 11.1–15.9)
Immature Grans (Abs): 0 10*3/uL (ref 0.0–0.1)
Immature Granulocytes: 0 %
Lymphocytes Absolute: 1.3 10*3/uL (ref 0.7–3.1)
Lymphs: 14 %
MCH: 27.6 pg (ref 26.6–33.0)
MCHC: 32.4 g/dL (ref 31.5–35.7)
MCV: 85 fL (ref 79–97)
Monocytes Absolute: 0.4 10*3/uL (ref 0.1–0.9)
Monocytes: 5 %
Neutrophils Absolute: 7.3 10*3/uL — ABNORMAL HIGH (ref 1.4–7.0)
Neutrophils: 81 %
Platelets: 260 10*3/uL (ref 150–450)
RBC: 5.04 x10E6/uL (ref 3.77–5.28)
RDW: 12.6 % (ref 11.7–15.4)
WBC: 9.1 10*3/uL (ref 3.4–10.8)

## 2022-03-26 LAB — COMPREHENSIVE METABOLIC PANEL
ALT: 14 IU/L (ref 0–32)
AST: 18 IU/L (ref 0–40)
Albumin/Globulin Ratio: 1.5 (ref 1.2–2.2)
Albumin: 4.6 g/dL (ref 4.0–5.0)
Alkaline Phosphatase: 61 IU/L (ref 44–121)
BUN/Creatinine Ratio: 12 (ref 9–23)
BUN: 9 mg/dL (ref 6–20)
Bilirubin Total: 0.4 mg/dL (ref 0.0–1.2)
CO2: 23 mmol/L (ref 20–29)
Calcium: 9.8 mg/dL (ref 8.7–10.2)
Chloride: 98 mmol/L (ref 96–106)
Creatinine, Ser: 0.77 mg/dL (ref 0.57–1.00)
Globulin, Total: 3 g/dL (ref 1.5–4.5)
Glucose: 93 mg/dL (ref 70–99)
Potassium: 4 mmol/L (ref 3.5–5.2)
Sodium: 137 mmol/L (ref 134–144)
Total Protein: 7.6 g/dL (ref 6.0–8.5)
eGFR: 111 mL/min/{1.73_m2} (ref >59)

## 2022-05-22 DIAGNOSIS — F4323 Adjustment disorder with mixed anxiety and depressed mood: Secondary | ICD-10-CM | POA: Diagnosis not present

## 2022-05-29 DIAGNOSIS — F4323 Adjustment disorder with mixed anxiety and depressed mood: Secondary | ICD-10-CM | POA: Diagnosis not present

## 2022-06-12 DIAGNOSIS — F4323 Adjustment disorder with mixed anxiety and depressed mood: Secondary | ICD-10-CM | POA: Diagnosis not present

## 2022-07-10 DIAGNOSIS — F4323 Adjustment disorder with mixed anxiety and depressed mood: Secondary | ICD-10-CM | POA: Diagnosis not present

## 2022-07-24 DIAGNOSIS — F4323 Adjustment disorder with mixed anxiety and depressed mood: Secondary | ICD-10-CM | POA: Diagnosis not present

## 2022-07-27 ENCOUNTER — Ambulatory Visit
Admission: EM | Admit: 2022-07-27 | Discharge: 2022-07-27 | Disposition: A | Discharge status: Home / Self Care | Payer: BC Managed Care – PPO | Attending: Internal Medicine | Admitting: Internal Medicine

## 2022-07-27 ENCOUNTER — Encounter: Payer: Self-pay | Admitting: Emergency Medicine

## 2022-07-27 DIAGNOSIS — B349 Viral infection, unspecified: Secondary | ICD-10-CM | POA: Diagnosis not present

## 2022-07-27 DIAGNOSIS — R197 Diarrhea, unspecified: Secondary | ICD-10-CM

## 2022-07-27 DIAGNOSIS — R051 Acute cough: Secondary | ICD-10-CM | POA: Diagnosis not present

## 2022-07-27 DIAGNOSIS — R112 Nausea with vomiting, unspecified: Secondary | ICD-10-CM | POA: Diagnosis not present

## 2022-07-27 MED ORDER — ONDANSETRON HCL 4 MG/2ML IJ SOLN
4.0000 mg | Freq: Once | INTRAMUSCULAR | Status: AC
Start: 2022-07-27 — End: 2022-07-27
  Administered 2022-07-27: 4 mg via INTRAMUSCULAR

## 2022-07-27 NOTE — Discharge Instructions (Signed)
You have a viral illness which should run its course and self resolve with symptomatic treatment.  I have given you nausea medication injection today in urgent care.  Please follow-up if symptoms persist or worsen.  Ensure clear oral fluid intake with Gatorade, water, or Pedialyte.  Follow-up at the ER if symptoms persist or worsen.

## 2022-07-27 NOTE — ED Provider Notes (Signed)
[name]    CSN: [id number] Arrival date & time: 07/27/22  3710      History   Chief Complaint Chief Complaint  Patient presents with   Emesis   Fatigue   Nausea    HPI Pamela Maynard is a 26 y.o. female.   Patient presents with nausea, vomiting, fatigue, cough, nasal congestion that started about 3 days ago.  Patient denies any known sick contacts.  Denies blood in emesis.  Patient denies chest pain, shortness of breath, sore throat, ear pain, diarrhea, abdominal pain.  Patient reports that her last menstrual cycle was approximately 1 week ago.  She denies any concern for pregnancy given that she only has intercourse with female partner.  Patient reports that she has taken previously prescribed ondansetron with last dose being last night with minimal improvement.  She has been having difficulty keeping food and fluids down.   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      Home Medications    Prior to Admission medications   Medication Sig Start Date End Date Taking? Authorizing Provider  colchicine 0.6 MG tablet Take 1 tablet (0.6 mg total) by mouth daily for 14 days. Patient not taking: Reported on 10/30/2018 10/26/18 11/09/18  Shaune Pollack, MD  hydrOXYzine (ATARAX/VISTARIL) 25 MG tablet Take 25 mg by mouth every 6 (six) hours as needed for itching.    [provider]  ondansetron (ZOFRAN-ODT) 4 MG disintegrating tablet Take 1 tablet (4 mg total) by mouth every 8 (eight) hours as needed. 03/25/22   Tomi Bamberger, PA-C  potassium chloride SA (KLOR-CON M) 20 MEQ tablet Take 1 tablet (20 mEq total) by mouth daily for 10 days. 11/28/21 12/08/21  Nira Conn, MD  TARINA FE 1/20 EQ 1-20 MG-MCG tablet Take 1 tablet by mouth daily. 09/10/18   [provider]    Family History Family History  Problem Relation Age of Onset   Hypertension Mother    Diabetes Mother     Social History Social History   Tobacco Use   Smoking status: Never   Smokeless tobacco: Never  Vaping Use   Vaping Use: Never used  Substance Use Topics   Alcohol use: Yes    Comment: occasinally   Drug use: Yes    Types: Marijuana    Comment: 2-3 times a week     Allergies   Patient has no known allergies.   Review of Systems Review of Systems Per HPI  Physical Exam Triage Vital Signs ED Triage Vitals  Enc Vitals Group     BP 07/27/22 0919 (!) 151/86     Pulse Rate 07/27/22 0919 (!) 52     Resp 07/27/22 0919 18     Temp 07/27/22 0919 98.5 F (36.9 C)     Temp src --      SpO2 07/27/22 0919 98 %  Weight --      Height --      Head Circumference --      Peak Flow --      Pain Score 07/27/22 0918 0     Pain Loc --      Pain Edu? --      Excl. in GC? --    No data found.  Updated Vital Signs BP (!) 151/86   Pulse (!) 52   Temp 98.5 F (36.9 C)   Resp 18   SpO2 98%   Visual Acuity Right Eye Distance:   Left Eye Distance:   Bilateral Distance:    Right Eye Near:   Left Eye Near:    Bilateral Near:     Physical Exam Constitutional:      General: She is not in acute distress.    Appearance: Normal appearance. She is not toxic-appearing or diaphoretic.  HENT:     Head: Normocephalic and atraumatic.     Right Ear: Tympanic membrane and ear canal normal.     Left Ear: Tympanic membrane and ear canal normal.     Nose:  Congestion present.     Mouth/Throat:     Mouth: Mucous membranes are moist.     Pharynx: No posterior oropharyngeal erythema.  Eyes:     Extraocular Movements: Extraocular movements intact.     Conjunctiva/sclera: Conjunctivae normal.     Pupils: Pupils are equal, round, and reactive to light.  Cardiovascular:     Rate and Rhythm: Normal rate and regular rhythm.     Pulses: Normal pulses.     Heart sounds: Normal heart sounds.  Pulmonary:     Effort: Pulmonary effort is normal. No respiratory distress.     Breath sounds: Normal breath sounds. No stridor. No wheezing, rhonchi or rales.  Abdominal:     General: Abdomen is flat. Bowel sounds are normal. There is no distension.     Palpations: Abdomen is soft.     Tenderness: There is no abdominal tenderness.  Musculoskeletal:        General: Normal range of motion.     Cervical back: Normal range of motion.  Skin:    General: Skin is warm and dry.  Neurological:     General: No focal deficit present.     Mental Status: She is alert and oriented to person, place, and time. Mental status is at baseline.  Psychiatric:        Mood and Affect: Mood normal.        Behavior: Behavior normal.      UC Treatments / Results  Labs (all labs ordered are listed, but only abnormal results are displayed) Labs Reviewed - No data to display  EKG   Radiology No results found.  Procedures Procedures (including critical care time)  Medications Ordered in UC Medications  ondansetron (ZOFRAN) injection 4 mg (4 mg Intramuscular Given 07/27/22 1002)    Initial Impression / Assessment and Plan / UC Course  I have reviewed the triage vital signs and the nursing notes.  Pertinent labs & imaging results that were available during my care of the patient were reviewed by me and considered in my medical decision making (see chart for details).     Symptoms appear viral in etiology.  No signs of acute abdomen or dehydration on exam so no  need for emergent evaluation at this time.  Ondansetron IM administered in urgent care to help alleviate nausea.  Advised patient to ensure clear  oral fluid intake and bland diet to help alleviate symptoms.  Also discussed supportive care and symptom management with over-the-counter medications for cough and congestion.  Patient was advised to follow-up if symptoms persist or worsen.  Patient verbalized understanding and was agreeable with plan. Final Clinical Impressions(s) / UC Diagnoses   Final diagnoses:  Viral illness  Nausea vomiting and diarrhea  Acute cough     Discharge Instructions      You have a viral illness which should run its course and self resolve with symptomatic treatment.  I have given you nausea medication injection today in urgent care.  Please follow-up if symptoms persist or worsen.  Ensure clear oral fluid intake with Gatorade, water, or Pedialyte.  Follow-up at the ER if symptoms persist or worsen.    ED Prescriptions   None    PDMP not reviewed this encounter.   Gustavus Bryant, [place] 07/27/22 1012

## 2022-07-27 NOTE — ED Triage Notes (Signed)
Pt is present today with c/o nausea, vomiting, fatigue x3days

## 2022-08-14 DIAGNOSIS — F4323 Adjustment disorder with mixed anxiety and depressed mood: Secondary | ICD-10-CM | POA: Diagnosis not present

## 2022-09-07 DIAGNOSIS — Z0000 Encounter for general adult medical examination without abnormal findings: Secondary | ICD-10-CM | POA: Diagnosis not present

## 2023-11-20 ENCOUNTER — Ambulatory Visit
Admission: EM | Admit: 2023-11-20 | Discharge: 2023-11-20 | Disposition: A | Discharge status: Home / Self Care | Attending: Internal Medicine | Admitting: Internal Medicine

## 2023-11-20 DIAGNOSIS — R112 Nausea with vomiting, unspecified: Secondary | ICD-10-CM

## 2023-11-20 MED ORDER — PROMETHAZINE HCL 12.5 MG PO TABS: 12.5000 mg | ORAL_TABLET | Freq: Four times a day (QID) | ORAL | 0 refills | Status: AC | PRN

## 2023-11-20 NOTE — Discharge Instructions (Signed)
 I have prescribed you a nausea medicine to take as needed. Ensure adequate fluids and please be aware that nausea medication can make you drowsy. Follow up with pcp and gynecologist.

## 2023-11-20 NOTE — ED Triage Notes (Addendum)
 Pt presents with nausea that she states onset when her cycle starts. Symptoms onset Thursday. Pt states this is stopping her day to day activities. Pt has tried Pedialyte, ginger ale, and water but still gets nauseous often throughout the day. Pt denies diarrhea but does admit emesis that started Thursday as well.   Pt states Zofran has not helped at all.

## 2023-11-20 NOTE — ED Provider Notes (Addendum)
 [name]    CSN: [id number] Arrival date & time: 11/20/23  0849      History   Chief Complaint Chief Complaint  Patient presents with   Nausea    HPI Pamela Maynard is a 28 y.o. female.   Patient presents with nausea and vomiting that started about 6 days ago when her menstrual cycle started.  Reports that she typically gets nausea and vomiting during her menstrual cycles.  Reports that this is a recent thing over the past few years.  She was previously on oral birth control which stopped in 2021.  After birth control stopped, she started having "intense menstrual cycles" with nausea and vomiting.  She has not followed up with a gynecologist for this and does not have a PCP.  She has been taking previously prescribed Zofran with no improvement.  She has been able to tolerate fluids but no foods.  Denies any associated diarrhea.  Denies any known sick contacts.  Patient reports that she has monthly menstrual cycles.  She has no concern for pregnancy as she has not had intercourse with a female in multiple years per her report.     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      Home Medications    Prior to Admission medications   Medication Sig Start Date End Date Taking? Authorizing Provider  promethazine (PHENERGAN) 12.5 MG tablet Take 1 tablet (12.5 mg total) by mouth every 6  (six) hours as needed for nausea or vomiting. 11/20/23  Yes Nykeem Citro, Rolly Salter E, FNP  colchicine 0.6 MG tablet Take 1 tablet (0.6 mg total) by mouth daily for 14 days. Patient not taking: Reported on 10/30/2018 10/26/18 11/09/18  Shaune Pollack, MD  hydrOXYzine (ATARAX/VISTARIL) 25 MG tablet Take 25 mg by mouth every 6 (six) hours as needed for itching.    [provider]  ondansetron (ZOFRAN-ODT) 4 MG disintegrating tablet Take 1 tablet (4 mg total) by mouth every 8 (eight) hours as needed. 03/25/22   Tomi Bamberger, PA-C  potassium chloride SA (KLOR-CON M) 20 MEQ tablet Take 1 tablet (20 mEq total) by mouth daily for 10 days. 11/28/21 12/08/21  Nira Conn, MD  TARINA FE 1/20 EQ 1-20 MG-MCG tablet Take 1 tablet by mouth daily. 09/10/18   [provider]    Family History Family History  Problem Relation Age of Onset   Hypertension Mother    Diabetes Mother     Social History Social History   Tobacco Use   Smoking status: Never   Smokeless tobacco: Never  Vaping Use   Vaping status: Never Used  Substance Use Topics   Alcohol use: Yes    Comment: occasinally   Drug use: Yes    Types: Marijuana    Comment: 2-3 times a week     Allergies   Patient has no known allergies.   Review of  Systems Review of Systems Per HPI  Physical Exam Triage Vital Signs ED Triage Vitals  Encounter Vitals Group     BP 11/20/23 0959 (!) 144/87     Systolic BP Percentile --      Diastolic BP Percentile --      Pulse Rate 11/20/23 0959 (!) 49     Resp 11/20/23 0959 16     Temp 11/20/23 0959 97.9 F (36.6 C)     Temp Source 11/20/23 0959 Oral     SpO2 11/20/23 0959 98 %     Weight --      Height --      Head Circumference --      Peak Flow --      Pain Score 11/20/23 0957 0     Pain Loc --      Pain Education --      Exclude from Growth Chart --    No data found.  Updated Vital Signs BP (!) 144/87 (BP Location: Left Arm)   Pulse (!) 49   Temp 97.9 F (36.6 C)  (Oral)   Resp 16   LMP 11/15/2023 (Exact Date)   SpO2 98%   Visual Acuity Right Eye Distance:   Left Eye Distance:   Bilateral Distance:    Right Eye Near:   Left Eye Near:    Bilateral Near:     Physical Exam Constitutional:      General: She is not in acute distress.    Appearance: Normal appearance. She is not toxic-appearing or diaphoretic.  HENT:     Head: Normocephalic and atraumatic.     Mouth/Throat:     Mouth: Mucous membranes are moist.     Pharynx: No posterior oropharyngeal erythema.  Eyes:     Extraocular Movements: Extraocular movements intact.     Conjunctiva/sclera: Conjunctivae normal.  Cardiovascular:     Rate and Rhythm: Normal rate and regular rhythm.     Pulses: Normal pulses.     Heart sounds: Normal heart sounds.  Pulmonary:     Effort: Pulmonary effort is normal. No respiratory distress.     Breath sounds: Normal breath sounds.  Abdominal:     General: Bowel sounds are normal. There is no distension.     Palpations: Abdomen is soft.     Tenderness: There is no abdominal tenderness.  Neurological:     General: No focal deficit present.     Mental Status: She is alert and oriented to person, place, and time. Mental status is at baseline.  Psychiatric:        Mood and Affect: Mood normal.        Behavior: Behavior normal.        Thought Content: Thought content normal.        Judgment: Judgment normal.      UC Treatments / Results  Labs (all labs ordered are listed, but only abnormal results are displayed) Labs Reviewed - No data to display  EKG   Radiology No results found.  Procedures Procedures (including critical care time)  Medications Ordered in UC Medications - No data to display  Initial Impression / Assessment and Plan / UC Course  I have reviewed the triage vital signs and the nursing notes.  Pertinent labs & imaging results that were available during my care of the patient were reviewed by me and considered in my  medical decision making (see chart for details).     Patient presenting with nausea and vomiting related to  her monthly menstrual cycles.  There are no signs of acute abdomen or dehydration on exam that would warrant imaging or emergent evaluation.  Patient reporting no improvement with Zofran.  Therefore, will trial Phenergan.  Although, educated patient this can make her drowsy.  Patient's heart rate is 49 in triage but this appears baseline for patient.  Discussed Phenergan prescription with colleague MD given patient's heart history and heart rate. MD was agreeable that low dose phenergan is safe with precautions. Will proceed with Phenergan at low dose and educated patient about drowsiness and follow-up if symptoms persist or worsen.  Patient states that she no longer takes potassium or any other medications daily so Phenergan should be safe.  Patient provided with contact information for gynecology for follow-up for these current symptoms given they are chronic in nature.  PCP appointment made for patient as well.  Patient verbalized understanding and was agreeable with plan. Final Clinical Impressions(s) / UC Diagnoses   Final diagnoses:  Nausea and vomiting, unspecified vomiting type     Discharge Instructions      I have prescribed you a nausea medicine to take as needed. Ensure adequate fluids and please be aware that nausea medication can make you drowsy. Follow up with pcp and gynecologist.     ED Prescriptions     Medication Sig Dispense Auth. Provider   promethazine (PHENERGAN) 12.5 MG tablet Take 1 tablet (12.5 mg total) by mouth every 6 (six) hours as needed for nausea or vomiting. 12 tablet [place], Acie Fredrickson, [place]      PDMP not reviewed this encounter.   Gustavus Bryant, [place] 11/20/23 1100    [address] [place] 11/20/23 1101

## 2024-01-31 ENCOUNTER — Ambulatory Visit: Admitting: Family

## 2024-02-28 ENCOUNTER — Ambulatory Visit (INDEPENDENT_AMBULATORY_CARE_PROVIDER_SITE_OTHER): Admitting: Family

## 2024-02-28 VITALS — BP 100/67 | HR 69 | Temp 97.8°F | Resp 16 | Ht 63.0 in | Wt 161.8 lb

## 2024-02-28 DIAGNOSIS — Z1329 Encounter for screening for other suspected endocrine disorder: Secondary | ICD-10-CM | POA: Diagnosis not present

## 2024-02-28 DIAGNOSIS — Z1159 Encounter for screening for other viral diseases: Secondary | ICD-10-CM | POA: Diagnosis not present

## 2024-02-28 DIAGNOSIS — N946 Dysmenorrhea, unspecified: Secondary | ICD-10-CM

## 2024-02-28 DIAGNOSIS — Z13228 Encounter for screening for other metabolic disorders: Secondary | ICD-10-CM | POA: Diagnosis not present

## 2024-02-28 DIAGNOSIS — Z0000 Encounter for general adult medical examination without abnormal findings: Secondary | ICD-10-CM | POA: Diagnosis not present

## 2024-02-28 DIAGNOSIS — Z13220 Encounter for screening for lipoid disorders: Secondary | ICD-10-CM | POA: Diagnosis not present

## 2024-02-28 DIAGNOSIS — Z130 Encounter for screening for diseases of the blood and blood-forming organs and certain disorders involving the immune mechanism: Secondary | ICD-10-CM

## 2024-02-28 DIAGNOSIS — Z7689 Persons encountering health services in other specified circumstances: Secondary | ICD-10-CM

## 2024-02-28 DIAGNOSIS — Z131 Encounter for screening for diabetes mellitus: Secondary | ICD-10-CM | POA: Diagnosis not present

## 2024-02-28 DIAGNOSIS — Z124 Encounter for screening for malignant neoplasm of cervix: Secondary | ICD-10-CM

## 2024-02-28 NOTE — Progress Notes (Signed)
 Patient wants a referral to OBGYN about menstrual cramps

## 2024-02-28 NOTE — Progress Notes (Signed)
 Subjective:    Pamela Maynard - 28 y.o. female MRN [id number]  Date of birth: [birth year]  HPI  Pamela Maynard is to establish care and annual physical exam.   Current issues and/or concerns: - States menstrual pain persisting but recently improving. Taking over-the-counter medications to help. States she would like referral to Gynecology for cervical cancer screening/women's health maintenance.  - No further issues/concerns for discussion today.  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 She has never used smokeless tobacco. She reports current alcohol use. She reports current drug use. Drug: Marijuana.   Family History  family history includes Diabetes in her mother; Hypertension in her mother.   Medications: reviewed and updated   Objective:   Physical Exam BP 100/67   Pulse 69   Temp 97.8 F (36.6 C) (Oral)   Resp 16   Ht 5' 3 (1.6 m)   Wt 161 lb 12.8 oz (73.4 kg)   LMP 02/12/2024 (Approximate)   SpO2 98%   BMI 28.66 kg/m   Physical Exam HENT:     Head: Normocephalic and atraumatic.     Right Ear: Tympanic membrane, ear canal and external ear normal.     Left Ear: Tympanic membrane, ear canal and external ear normal.     Nose: Nose normal.     Mouth/Throat:     Mouth: Mucous membranes are moist.     Pharynx: Oropharynx is clear.   Eyes:     Extraocular Movements: Extraocular movements intact.     Conjunctiva/sclera: Conjunctivae normal.     Pupils: Pupils are equal, round, and reactive to light.   Neck:     Thyroid: No thyroid mass, thyromegaly or thyroid  tenderness.   Cardiovascular:     Rate and Rhythm: Normal rate and regular rhythm.     Pulses: Normal pulses.     Heart sounds: Normal heart sounds.  Pulmonary:     Effort: Pulmonary effort is normal.     Breath sounds: Normal breath sounds.  Chest:     Comments: Patient declined. Abdominal:     General: Bowel sounds are normal.     Palpations: Abdomen is soft.  Genitourinary:    Comments: Patient declined.  Musculoskeletal:        General: Normal range of motion.     Right shoulder: Normal.     Left shoulder: Normal.     Right upper arm: Normal.     Left upper arm: Normal.     Right elbow: Normal.     Left elbow: Normal.     Right forearm: Normal.     Left forearm: Normal.     Right wrist: Normal.     Left wrist: Normal.     Right hand: Normal.     Left hand: Normal.     Cervical back: Normal, normal range of motion and neck supple.     Thoracic back: Normal.     Lumbar back: Normal.     Right hip: Normal.     Left hip: Normal.  Right upper leg: Normal.     Left upper leg: Normal.     Right knee: Normal.     Left knee: Normal.     Right lower leg: Normal.     Left lower leg: Normal.     Right ankle: Normal.     Left ankle: Normal.     Right foot: Normal.     Left foot: Normal.   Skin:    General: Skin is warm and dry.     Capillary Refill: Capillary refill takes less than 2 seconds.   Neurological:     General: No focal deficit present.     Mental Status: She is alert and oriented to person, place, and time.   Psychiatric:        Mood and Affect: Mood normal.        Behavior: Behavior normal.       Assessment & Plan:  1. Encounter to establish care (Primary) 2. Annual physical exam - Counseled on 150 minutes of exercise per week as tolerated, healthy eating (including decreased daily intake of saturated fats, cholesterol, added sugars, sodium), STI prevention, and routine healthcare maintenance.  3. Screening for metabolic disorder - Routine  screening.  - CMP14+EGFR  4. Screening for deficiency anemia - Routine screening.  - CBC  5. Diabetes mellitus screening - Routine screening.  - Hemoglobin A1c  6. Screening cholesterol level - Routine screening.  - Lipid panel  7. Thyroid disorder screen - Routine screening.  - TSH  8. Need for hepatitis C screening test - Routine screening.  - Hepatitis C Antibody  9. Cervical cancer screening 10. Dysmenorrhea - Referral to Gynecology for evaluation/management. - Ambulatory referral to Gynecology    Patient was given clear instructions to go to Emergency Department or return to medical center if symptoms don't improve, worsen, or new problems develop.The patient verbalized understanding.  I discussed the assessment and treatment plan with the patient. The patient was provided an opportunity to ask questions and all were answered. The patient agreed with the plan and demonstrated an understanding of the instructions.   The patient was advised to call back or seek an in-person evaluation if the symptoms worsen or if the condition fails to improve as anticipated.    Greig Drones, NP 02/28/2024, 9:05 AM Primary Care at [facility]

## 2024-02-29 ENCOUNTER — Ambulatory Visit: Payer: Self-pay | Admitting: Family

## 2024-02-29 LAB — CBC
Hematocrit: 38.3 % (ref 34.0–46.6)
Hemoglobin: 12.3 g/dL (ref 11.1–15.9)
MCH: 28.5 pg (ref 26.6–33.0)
MCHC: 32.1 g/dL (ref 31.5–35.7)
MCV: 89 fL (ref 79–97)
Platelets: 240 10*3/uL (ref 150–450)
RBC: 4.32 x10E6/uL (ref 3.77–5.28)
RDW: 13.1 % (ref 11.7–15.4)
WBC: 5.2 10*3/uL (ref 3.4–10.8)

## 2024-02-29 LAB — CMP14+EGFR
ALT: 15 IU/L (ref 0–32)
AST: 17 IU/L (ref 0–40)
Albumin: 4.3 g/dL (ref 4.0–5.0)
Alkaline Phosphatase: 54 IU/L (ref 44–121)
BUN/Creatinine Ratio: 17 (ref 9–23)
BUN: 14 mg/dL (ref 6–20)
Bilirubin Total: 0.3 mg/dL (ref 0.0–1.2)
CO2: 20 mmol/L (ref 20–29)
Calcium: 9.8 mg/dL (ref 8.7–10.2)
Chloride: 105 mmol/L (ref 96–106)
Creatinine, Ser: 0.83 mg/dL (ref 0.57–1.00)
Globulin, Total: 2.5 g/dL (ref 1.5–4.5)
Glucose: 68 mg/dL — ABNORMAL LOW (ref 70–99)
Potassium: 4.5 mmol/L (ref 3.5–5.2)
Sodium: 143 mmol/L (ref 134–144)
Total Protein: 6.8 g/dL (ref 6.0–8.5)
eGFR: 100 mL/min/{1.73_m2} (ref >59)

## 2024-02-29 LAB — LIPID PANEL
Chol/HDL Ratio: 1.8 ratio (ref 0.0–4.4)
Cholesterol, Total: 134 mg/dL (ref 100–199)
HDL: 75 mg/dL (ref >39)
LDL Chol Calc (NIH): 50 mg/dL (ref 0–99)
Triglycerides: 36 mg/dL (ref 0–149)
VLDL Cholesterol Cal: 9 mg/dL (ref 5–40)

## 2024-02-29 LAB — HEPATITIS C ANTIBODY: Hep C Virus Ab: NONREACTIVE

## 2024-02-29 LAB — HEMOGLOBIN A1C
Est. average glucose Bld gHb Est-mCnc: 108 mg/dL
Hgb A1c MFr Bld: 5.4 % (ref 4.8–5.6)

## 2024-02-29 LAB — TSH: TSH: 0.757 u[IU]/mL (ref 0.450–4.500)

## 2024-06-05 ENCOUNTER — Encounter: Payer: Self-pay | Admitting: Family Medicine

## 2024-06-05 ENCOUNTER — Other Ambulatory Visit: Payer: Self-pay

## 2024-06-05 ENCOUNTER — Other Ambulatory Visit (HOSPITAL_COMMUNITY)
Admission: RE | Admit: 2024-06-05 | Discharge: 2024-06-05 | Disposition: A | Discharge status: Home / Self Care | Source: Ambulatory Visit | Attending: Family Medicine | Admitting: Family Medicine

## 2024-06-05 ENCOUNTER — Ambulatory Visit (INDEPENDENT_AMBULATORY_CARE_PROVIDER_SITE_OTHER): Payer: Self-pay | Admitting: Certified Nurse Midwife

## 2024-06-05 VITALS — BP 107/68 | HR 59 | Wt 161.4 lb

## 2024-06-05 DIAGNOSIS — Z124 Encounter for screening for malignant neoplasm of cervix: Secondary | ICD-10-CM | POA: Diagnosis present

## 2024-06-05 DIAGNOSIS — Z01419 Encounter for gynecological examination (general) (routine) without abnormal findings: Secondary | ICD-10-CM | POA: Diagnosis not present

## 2024-06-05 DIAGNOSIS — Z1331 Encounter for screening for depression: Secondary | ICD-10-CM

## 2024-06-05 DIAGNOSIS — N946 Dysmenorrhea, unspecified: Secondary | ICD-10-CM

## 2024-06-05 DIAGNOSIS — Z113 Encounter for screening for infections with a predominantly sexual mode of transmission: Secondary | ICD-10-CM | POA: Diagnosis not present

## 2024-06-06 NOTE — Progress Notes (Signed)
 ANNUAL EXAM Patient name: Pamela Maynard MRN [id number]  Date of birth: Jan 09, 1996 Chief Complaint:   Gynecologic Exam  History of Present Illness:   Pamela Maynard is a 28 y.o. nulliparous African-American female being seen today for a routine annual exam.  Current complaints: Has painful periods that are regular and not excessively heavy. Takes a lot of tylenol  and some ibuprofen, would prefer more non-pharmacological methods to help her periods.   Needs a pap but did not have a good experience with her last GYN who refused to prescribe birth control without pap testing. Prefers a female provider for first pap so was transferred from Dr. Beuford schedule to [facility] schedule.  Patient's last menstrual period was 06/03/2024.  The pregnancy intention screening data noted above was reviewed. Potential methods of contraception were discussed. The patient elected to proceed with No data recorded.   Last pap Never. Results were: N/A. H/O abnormal pap: no Last mammogram: Never (age). Results were: N/A. Family h/o breast cancer: no Last colonoscopy: Never (age). Results were: N/A. Family h/o colorectal cancer: no     06/05/2024    5:12 PM 02/28/2024    8:25 AM 11/20/2018    4:09 PM 10/30/2018   10:14 AM  Depression screen PHQ 2/9  Decreased Interest 1 0 0 0  Down, Depressed, Hopeless 1 0 0 0  PHQ - 2 Score 2 0 0 0  Altered sleeping 0     Tired, decreased energy 1     Change in appetite 0     Feeling bad or failure about yourself  0     Trouble concentrating 0     Moving slowly or fidgety/restless 0     Suicidal thoughts 0     PHQ-9 Score 3           06/05/2024    5:13 PM 02/28/2024    8:26 AM  GAD 7 : Generalized Anxiety Score  Nervous, Anxious, on Edge 3 0  Control/stop worrying 1 0  Worry too much - different things 1 0  Trouble relaxing 1 1  Restless 0 0  Easily annoyed or irritable 1 1  Afraid - awful might happen 0 0  Total GAD 7 Score 7 2  Anxiety Difficulty  Somewhat  difficult     Review of Systems:   Pertinent items are noted in HPI Denies any headaches, blurred vision, fatigue, shortness of breath, chest pain, abdominal pain, abnormal vaginal discharge/itching/odor/irritation, problems with periods, bowel movements, urination, or intercourse unless otherwise stated above.  Pertinent History Reviewed:  Reviewed past medical,surgical, social and family history.  Reviewed problem list, medications and allergies.  Physical Assessment:   Vitals:   06/05/24 1604  BP: 107/68  Pulse: (!) 59  Weight: 161 lb 6.4 oz (73.2 kg)   Body mass index is 28.59 kg/m.   Physical Examination:  General appearance - well appearing, and in no distress Mental status - alert, oriented to person, place, and time Psych:  She has a normal mood and affect Skin - warm and dry, normal color, no suspicious lesions noted Chest - effort normal, no problems with respiration noted Heart - normal rate and regular rhythm Neck:  midline trachea, no thyromegaly or nodules Breasts - breasts appear normal, no suspicious masses, no skin or nipple changes or  axillary nodes Abdomen - soft, nontender, nondistended, no masses or organomegaly Pelvic - VULVA: normal appearing vulva with no masses, tenderness or lesions   VAGINA: normal appearing vagina with normal color  and discharge, no lesions   CERVIX: normal appearing cervix without discharge or lesions, no CMT Thin prep pap is done with HR HPV cotesting Extremities:  No swelling or varicosities noted  Chaperone present for exam  No results found for this or any previous visit (from the past 24 hours).  Assessment & Plan:  1. Encounter for annual routine gynecological examination - Routine preventative health maintenance measures emphasized. - Mammogram: @ 26yo, or sooner if problems - Colonoscopy: @ 26yo, or sooner if problems  2. Pap smear for cervical cancer screening (Primary) - Will follow up results of pap smear and  manage accordingly. - Cytology - PAP  3. Dysmenorrhea - Advised to start tracking periods (has an Apple watch) and start taking 600mg  ibuprofen the day before and during her period - Also discussed use of daily red raspberry leaf throughout the month for hormone support as well as turmeric milk during her period for anti-inflammatory effects.  No orders of the defined types were placed in this encounter.   Meds: No orders of the defined types were placed in this encounter.   Follow-up: Return in about 1 year (around 06/05/2025), or as needed, for ANN.  Cornell JONELLE Finder, CNM 06/06/2024 11:44 AM

## 2024-06-07 LAB — CYTOLOGY - PAP
Adequacy: ABSENT
Chlamydia: NEGATIVE
Comment: NEGATIVE
Comment: NEGATIVE
Comment: NORMAL
Diagnosis: NEGATIVE
Neisseria Gonorrhea: NEGATIVE
Trichomonas: NEGATIVE

## 2024-09-12 ENCOUNTER — Ambulatory Visit
Admission: EM | Admit: 2024-09-12 | Discharge: 2024-09-12 | Disposition: A | Discharge status: Home / Self Care | Payer: PRIVATE HEALTH INSURANCE | Attending: Internal Medicine | Admitting: Internal Medicine

## 2024-09-12 ENCOUNTER — Encounter: Payer: Self-pay | Admitting: Emergency Medicine

## 2024-09-12 DIAGNOSIS — R52 Pain, unspecified: Secondary | ICD-10-CM

## 2024-09-12 DIAGNOSIS — J111 Influenza due to unidentified influenza virus with other respiratory manifestations: Secondary | ICD-10-CM

## 2024-09-12 MED ORDER — OSELTAMIVIR PHOSPHATE 75 MG PO CAPS: 75.0000 mg | ORAL_CAPSULE | Freq: Two times a day (BID) | ORAL | 0 refills | Status: AC

## 2024-09-12 MED ORDER — ONDANSETRON 4 MG PO TBDP: 4.0000 mg | ORAL_TABLET | Freq: Three times a day (TID) | ORAL | 0 refills | Status: AC | PRN

## 2024-09-12 NOTE — ED Provider Notes (Signed)
 " [name]    CSN: [id number] Arrival date & time: 09/12/24  1251      History   Chief Complaint Chief Complaint  Patient presents with   Nasal Congestion   Generalized Body Aches   Fatigue   Diarrhea   Cough   Dizziness    HPI Pamela Maynard is a 29 y.o. female.   30 year old female presents urgent care with complaints of fatigue, chills, body aches, cough, congestion, diarrhea, lightheadedness.  This started about 3 days ago.  She has been using DayQuil and NyQuil and Tylenol  without much relief.  She does not have any sick contacts that she knows of.  She does not have much of an appetite but has not had any nausea or vomiting.  She denies any shortness of breath.  Her cough is very minor.   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      Home Medications    Prior to Admission medications  Medication Sig Start Date End Date Taking?  Authorizing Provider  ondansetron  (ZOFRAN -ODT) 4 MG disintegrating tablet Take 1 tablet (4 mg total) by mouth every 8 (eight) hours as needed for nausea or vomiting. 09/12/24  Yes Bubba Vanbenschoten A, PA-C  oseltamivir  (TAMIFLU ) 75 MG capsule Take 1 capsule (75 mg total) by mouth every 12 (twelve) hours. 09/12/24  Yes Malin Sambrano A, PA-C  promethazine  (PHENERGAN ) 12.5 MG tablet Take 1 tablet (12.5 mg total) by mouth every 6 (six) hours as needed for nausea or vomiting. Patient not taking: Reported on 09/12/2024 11/20/23   Hazen Darryle BRAVO, FNP    Family History Family History  Problem Relation Age of Onset   Hypertension Mother    Diabetes Mother     Social History Social History[1]   Allergies   Patient has no known allergies.   Review of Systems Review of Systems  Constitutional:  Positive for appetite change, chills and fatigue. Negative for fever.  HENT:  Positive for congestion. Negative for ear pain and sore throat.   Eyes:  Negative for pain and visual disturbance.  Respiratory:  Positive for cough. Negative for shortness of breath.   Cardiovascular:  Negative for chest pain and palpitations.  Gastrointestinal:  Positive for diarrhea. Negative for abdominal pain and vomiting.  Genitourinary:  Negative for dysuria and hematuria.  Musculoskeletal:  Positive for myalgias. Negative for arthralgias and back pain.  Skin:  Negative for color change and rash.  Neurological:  Positive for dizziness and light-headedness. Negative for seizures and syncope.  All other systems reviewed and are negative.    Physical Exam Triage Vital Signs ED Triage Vitals [09/12/24 1322]  Encounter Vitals Group     BP 117/80     Girls Systolic BP Percentile      Girls Diastolic BP Percentile      Boys Systolic BP Percentile      Boys Diastolic BP Percentile      Pulse Rate 85     Resp 14     Temp 98.5 F (36.9 C)     Temp Source Oral     SpO2 95 %     Weight      Height      Head  Circumference      Peak Flow      Pain Score 7     Pain Loc      Pain Education      Exclude from Growth Chart    No data found.  Updated Vital Signs BP 117/80 (BP Location: Left Arm)   Pulse 85   Temp 98.5 F (36.9 C) (Oral)   Resp 14   LMP 08/29/2024 (Exact Date)   SpO2 95%   Visual Acuity Right Eye Distance:   Left Eye Distance:   Bilateral Distance:    Right Eye Near:   Left Eye Near:    Bilateral Near:     Physical Exam Vitals and nursing note reviewed.  Constitutional:      General: She is not in acute distress.    Appearance: She is well-developed.  HENT:     Head: Normocephalic and atraumatic.     Right Ear: Tympanic membrane normal.     Left Ear: Tympanic membrane normal.     Nose: Nose normal.     Mouth/Throat:     Mouth: Mucous membranes are moist.  Eyes:     Conjunctiva/sclera: Conjunctivae normal.  Cardiovascular:     Rate and Rhythm: Normal rate and regular rhythm.     Heart sounds: No murmur heard. Pulmonary:     Effort: Pulmonary effort is normal. No respiratory distress.     Breath sounds: Normal breath sounds.  Abdominal:     Palpations: Abdomen is soft.     Tenderness: There is no abdominal tenderness.  Musculoskeletal:        General: No swelling.     Cervical back: Neck supple.  Skin:    General: Skin is warm and dry.     Capillary Refill: Capillary refill takes less than 2 seconds.  Neurological:     Mental Status: She is alert.  Psychiatric:        Mood and Affect: Mood normal.      UC Treatments / Results  Labs (all labs ordered are listed, but only abnormal results are displayed) Labs Reviewed - No data to display  EKG   Radiology No results found.  Procedures Procedures (including critical care time)  Medications Ordered in UC Medications - No data to display  Initial Impression / Assessment and Plan / UC Course  I have reviewed the triage vital signs and the nursing notes.  Pertinent labs & imaging results  that were available during my care of the patient were reviewed by me and considered in my medical decision making (see chart for details).     Influenza-like illness  Body aches   Symptoms and physical exam findings are most consistent with an influenza-like illness.  Unfortunately  we do not have testing available for influenza but given the presenting symptoms and duration this is most suggestive of influenza.  This is also a reasonable assumption given the significant prevalence of influenza in the community at this time.  We will send in a medication called Tamiflu  which can help your body fight by improving symptoms quicker and hopefully decreasing the duration of symptoms.  This medication can cause nausea and if severe nausea occurs then recommend discontinuing.  We will call in medication for nausea in case it is needed.  Influenza typically takes approximately 5 to 7 days for resolution of symptoms.  We recommend the following: Tamiflu  75 mg twice daily for 5 days.  This medication can cause nausea.  If severe nausea develops or nausea is not controlled with Zofran  then recommend discontinuing. Zofran  4 mg orally disintegrating tablet every 8 hours as needed for nausea.  Make sure to stay hydrated by drinking plenty of water. May alternate Tylenol  and ibuprofen for fevers or pain Avoid returning to work or being around groups of people until you are 24 hours without a fever without fever reducing medication Return to urgent care or PCP if symptoms worsen or fail to resolve.    Final Clinical Impressions(s) / UC Diagnoses   Final diagnoses:  Influenza-like illness  Body aches     Discharge Instructions      Symptoms and physical exam findings are most consistent with an influenza-like illness.  Unfortunately we do not have testing available for influenza but given the presenting symptoms and duration this is most suggestive of influenza.  This is also a reasonable assumption given  the significant prevalence of influenza in the community at this time.  We will send in a medication called Tamiflu  which can help your body fight by improving symptoms quicker and hopefully decreasing the duration of symptoms.  This medication can cause nausea and if severe nausea occurs then recommend discontinuing.  We will call in medication for nausea in case it is needed.  Influenza typically takes approximately 5 to 7 days for resolution of symptoms.  We recommend the following: Tamiflu  75 mg twice daily for 5 days.  This medication can cause nausea.  If severe nausea develops or nausea is not controlled with Zofran  then recommend discontinuing. Zofran  4 mg orally disintegrating tablet every 8 hours as needed for nausea.  Make sure to stay hydrated by drinking plenty of water. May alternate Tylenol  and ibuprofen for fevers or pain Avoid returning to work or being around groups of people until you are 24 hours without a fever without fever reducing medication Return to urgent care or PCP if symptoms worsen or fail to resolve.      ED Prescriptions     Medication Sig Dispense Auth. Provider   oseltamivir  (TAMIFLU ) 75 MG capsule Take 1 capsule (75 mg total) by mouth every 12 (twelve) hours. 10 capsule Bobbyjo Marulanda A, PA-C   ondansetron  (ZOFRAN -ODT) 4 MG disintegrating tablet Take 1 tablet (4 mg total) by mouth every 8 (eight) hours as needed for nausea or vomiting. 20 tablet Teresa Almarie LABOR, [place]      PDMP not reviewed this encounter.    [1]  Social History Tobacco Use   Smoking status: Never   Smokeless tobacco: Never  Vaping Use   Vaping status: Never Used  Substance Use Topics   Alcohol use: Yes    Comment: occasinally   Drug use: Yes    Types: Marijuana    Comment:  2-3 times a week     Teresa Almarie LABOR, [place] 09/12/24 1527  "

## 2024-09-12 NOTE — Discharge Instructions (Addendum)
 Symptoms and physical exam findings are most consistent with an influenza-like illness.  Unfortunately we do not have testing available for influenza but given the presenting symptoms and duration this is most suggestive of influenza.  This is also a reasonable assumption given the significant prevalence of influenza in the community at this time.  We will send in a medication called Tamiflu  which can help your body fight by improving symptoms quicker and hopefully decreasing the duration of symptoms.  This medication can cause nausea and if severe nausea occurs then recommend discontinuing.  We will call in medication for nausea in case it is needed.  Influenza typically takes approximately 5 to 7 days for resolution of symptoms.  We recommend the following: Tamiflu  75 mg twice daily for 5 days.  This medication can cause nausea.  If severe nausea develops or nausea is not controlled with Zofran  then recommend discontinuing. Zofran  4 mg orally disintegrating tablet every 8 hours as needed for nausea.  Make sure to stay hydrated by drinking plenty of water. May alternate Tylenol  and ibuprofen for fevers or pain Avoid returning to work or being around groups of people until you are 24 hours without a fever without fever reducing medication Return to urgent care or PCP if symptoms worsen or fail to resolve.

## 2024-09-12 NOTE — ED Triage Notes (Signed)
 Pt reports nasal congestion, body aches, fatigue, chills, diarrhea, intermittent lightheadedness, and fatigue x3 days. Unsure of fevers as not checking at home. 2 loose stools in last 24hrs. Dayquil, nyquil, and tylenol  taken with little relief. No sick contacts known.

## 2024-10-09 ENCOUNTER — Telehealth: Payer: PRIVATE HEALTH INSURANCE

## 2024-10-09 DIAGNOSIS — R238 Other skin changes: Secondary | ICD-10-CM

## 2024-10-09 NOTE — Progress Notes (Signed)
 No acute or chronic symptoms. Pt would just like to establish with a dermatologist for yearly skin checks. Advised she will need referral from PCP. Verbalized understanding and will reach out to her PCP.

## 2024-10-11 ENCOUNTER — Telehealth: Payer: Self-pay | Admitting: Family

## 2024-10-11 ENCOUNTER — Encounter: Payer: PRIVATE HEALTH INSURANCE | Admitting: Family

## 2024-10-11 NOTE — Progress Notes (Signed)
 Erroneous encounter-disregard

## 2024-10-11 NOTE — Telephone Encounter (Signed)
 Called pt to reschedule missed appt. Pt stated due to her work hours she was not able to come in and is requesting a video appt via MyChart. Can pt be seen for a dermatology referral on a Video visit? Please advise. I let pt know I will CB to schedule.

## 2025-02-27 ENCOUNTER — Encounter: Admitting: Family
